# Patient Record
Sex: Female | Born: 1935 | Race: White | Hispanic: No | Marital: Single
Health system: Midwestern US, Academic
[De-identification: ages and names within clinical notes are randomized; demographics above are authoritative.]

---

## 2018-06-15 LAB — COMPREHENSIVE METABOLIC PANEL
Lab: 0.3
Lab: 0.9
Lab: 10
Lab: 136
Lab: 15 — ABNORMAL HIGH (ref 0–14)
Lab: 18
Lab: 21
Lab: 24
Lab: 27
Lab: 3.8
Lab: 3.9
Lab: 52
Lab: 61
Lab: 7.8
Lab: 94
Lab: 98

## 2018-06-15 LAB — CBC: Lab: 9.2

## 2018-07-14 ENCOUNTER — Encounter: Admit: 2018-07-14 | Discharge: 2018-07-14 | Payer: MEDICARE

## 2018-07-14 DIAGNOSIS — I10 Essential (primary) hypertension: Principal | ICD-10-CM

## 2018-07-14 DIAGNOSIS — E119 Type 2 diabetes mellitus without complications: ICD-10-CM

## 2018-07-15 ENCOUNTER — Encounter: Admit: 2018-07-15 | Discharge: 2018-07-15 | Payer: MEDICARE

## 2018-07-16 ENCOUNTER — Encounter: Admit: 2018-07-16 | Discharge: 2018-07-16 | Payer: MEDICARE

## 2018-07-16 ENCOUNTER — Ambulatory Visit: Admit: 2018-07-16 | Discharge: 2018-07-17 | Payer: MEDICARE

## 2018-07-16 DIAGNOSIS — Z8249 Family history of ischemic heart disease and other diseases of the circulatory system: ICD-10-CM

## 2018-07-16 DIAGNOSIS — R079 Chest pain, unspecified: ICD-10-CM

## 2018-07-16 DIAGNOSIS — R0602 Shortness of breath: ICD-10-CM

## 2018-07-16 DIAGNOSIS — I10 Essential (primary) hypertension: Principal | ICD-10-CM

## 2018-07-16 DIAGNOSIS — E119 Type 2 diabetes mellitus without complications: ICD-10-CM

## 2018-07-16 MED ORDER — CARVEDILOL 25 MG PO TAB
25 mg | ORAL_TABLET | Freq: Two times a day (BID) | ORAL | 11 refills | 90.00000 days | Status: DC
Start: 2018-07-16 — End: 2018-08-31

## 2018-07-16 MED ORDER — CHLORTHALIDONE 25 MG PO TAB
25 mg | ORAL_TABLET | Freq: Two times a day (BID) | ORAL | 3 refills | Status: DC
Start: 2018-07-16 — End: 2018-09-10

## 2018-07-17 ENCOUNTER — Encounter: Admit: 2018-07-17 | Discharge: 2018-07-17 | Payer: MEDICARE

## 2018-07-20 ENCOUNTER — Encounter: Admit: 2018-07-20 | Discharge: 2018-07-20

## 2018-07-20 DIAGNOSIS — I10 Essential (primary) hypertension: Secondary | ICD-10-CM

## 2018-07-20 DIAGNOSIS — R0602 Shortness of breath: Secondary | ICD-10-CM

## 2018-07-20 DIAGNOSIS — E119 Type 2 diabetes mellitus without complications: Secondary | ICD-10-CM

## 2018-07-20 DIAGNOSIS — Z8249 Family history of ischemic heart disease and other diseases of the circulatory system: Secondary | ICD-10-CM

## 2018-07-20 DIAGNOSIS — R079 Chest pain, unspecified: Secondary | ICD-10-CM

## 2018-07-20 LAB — LIPID PROFILE
Lab: 109
Lab: 159
Lab: 22
Lab: 3
Lab: 47
Lab: 90

## 2018-07-20 LAB — LIVER FUNCTION PANEL
Lab: 0.2
Lab: 0.5
Lab: 22
Lab: 27
Lab: 3.8
Lab: 52
Lab: 7.5

## 2018-07-20 MED ORDER — HYDRALAZINE 25 MG PO TAB
12.5 mg | ORAL_TABLET | Freq: Two times a day (BID) | ORAL | 11 refills | 30.00000 days | Status: DC
Start: 2018-07-20 — End: 2018-07-24

## 2018-07-20 NOTE — Telephone Encounter
Received a message stating the [facility] has left a few messages asking for clonidine clarification.  Returned call to [employer].  Deb is reporting patient's blood pressure was 190/90 yesterday and is asking to keep clonidine PRN.    No sx reported.  Will review with [facility] and call [facility] back with recommendations.        Deb at [facility] phone number [phone]

## 2018-07-20 NOTE — Telephone Encounter
Discussed with [facility].  Plan to restart hydralazine 12.5 mg bid.  Called recommendations to Deb at the [facility] and sent to the pharmacy.  No further needs identified at this time.

## 2018-07-21 ENCOUNTER — Encounter: Admit: 2018-07-21 | Discharge: 2018-07-21

## 2018-07-21 DIAGNOSIS — I10 Essential (primary) hypertension: Secondary | ICD-10-CM

## 2018-07-21 DIAGNOSIS — R079 Chest pain, unspecified: Secondary | ICD-10-CM

## 2018-07-21 NOTE — Progress Notes
Echo ordered per [place]. Order faxed to [facility] scheduling.  Scheduling will call [facility] to schedule.

## 2018-07-22 ENCOUNTER — Encounter: Admit: 2018-07-22 | Discharge: 2018-07-22

## 2018-07-22 NOTE — Telephone Encounter
Bp 1 hour ago 228/96 cholorithodone 209/94 74. Discussed with [facility] orders received to increase hydralazine to 25mg  BID.Called to [place] at the [place] center

## 2018-07-23 ENCOUNTER — Encounter: Admit: 2018-07-23 | Discharge: 2018-07-23

## 2018-07-23 NOTE — Telephone Encounter
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  This morning her BP was 207/99 , no c/o symptoms.  She took her morning medications and Deb recheck her BP at 8:45 and it was 167/71  And sister c/o of being light headed and a slight HA.  I asked Deb to be sure she stays well hydrated and see how she is doing in the AM after two day of taking the higher dose of HCTZ. She agreed and will be in tough tomorrow. Gaynelle Cage, RN

## 2018-07-24 ENCOUNTER — Encounter: Admit: 2018-07-24 | Discharge: 2018-07-24

## 2018-07-24 DIAGNOSIS — I10 Essential (primary) hypertension: Secondary | ICD-10-CM

## 2018-07-27 ENCOUNTER — Ambulatory Visit: Admit: 2018-07-27 | Discharge: 2018-07-28

## 2018-07-27 DIAGNOSIS — R42 Dizziness and giddiness: Secondary | ICD-10-CM

## 2018-07-27 DIAGNOSIS — R0600 Dyspnea, unspecified: Secondary | ICD-10-CM

## 2018-07-27 DIAGNOSIS — R609 Edema, unspecified: Secondary | ICD-10-CM

## 2018-07-27 DIAGNOSIS — I10 Essential (primary) hypertension: Secondary | ICD-10-CM

## 2018-07-28 ENCOUNTER — Encounter: Admit: 2018-07-28 | Discharge: 2018-07-28

## 2018-07-28 DIAGNOSIS — I10 Essential (primary) hypertension: Secondary | ICD-10-CM

## 2018-07-28 DIAGNOSIS — R42 Dizziness and giddiness: Secondary | ICD-10-CM

## 2018-07-28 DIAGNOSIS — R0600 Dyspnea, unspecified: Secondary | ICD-10-CM

## 2018-07-28 DIAGNOSIS — R609 Edema, unspecified: Secondary | ICD-10-CM

## 2018-08-03 ENCOUNTER — Encounter: Admit: 2018-08-03 | Discharge: 2018-08-03

## 2018-08-03 NOTE — Telephone Encounter
Received verbal result for COVID testing - not detected/negative.  [place] center nurse will try to get [place] a hard copy of the result ASAP.

## 2018-08-03 NOTE — Telephone Encounter
Deb, from the [place] center called to let us know that pt is still having BP issues.  She faxed some recent blood pressures.  Pt has an OV tomorrow in [place] with Dr. Virgina Organ.  They are awaiting other testing and hope for results today.     Recent blood pressures:     DATE  TIME  Blood Pressures    07/26/2018 0700  156/87  07/27/2018 0700  184/88    07/28/2018 0700  156/87    2000  164/66    07/29/2018 2000  142/71  07/30/2018 0930  189/84  6//11  1950  179/78    6/12  08  157/75    2000  169/75    6/13  2100  202/98  6/14  2000  192/79    6/15  0700  184/82    She confirms pt is taking medications as ordered.

## 2018-08-03 NOTE — Telephone Encounter
08/03/18 called and spoke with pt nurse and she stated pt had an echo and duplex at [facility] and pt has seen pcp Dr. Helayne Seminole recently.     I have sent a STAT request for records to both places edh

## 2018-08-03 NOTE — Telephone Encounter
Deb, from the [place] center called to let us know that pt is still having BP issues.  She faxed some recent blood pressures.  Pt has an OV tomorrow in [place] with Dr. Virgina Organ.  They are awaiting other testing and hope for results today.     Recent blood pressures:     DATE  TIME  Blood Pressures    07/26/2018 0700  156/87  07/27/2018 0700  184/88    07/28/2018 0700  156/87    2000  164/66    07/29/2018 2000  142/71  07/30/2018 0930  189/84  6//11  1950  179/78    6/12  08  157/75    2000  169/75    6/13  2100  202/98  6/14  2000  192/79    6/15  0700  184/82    She confirms pt is taking medications as ordered.

## 2018-08-04 ENCOUNTER — Encounter: Admit: 2018-08-04 | Discharge: 2018-08-04

## 2018-08-04 ENCOUNTER — Ambulatory Visit: Admit: 2018-08-04 | Discharge: 2018-08-05

## 2018-08-04 DIAGNOSIS — R42 Dizziness and giddiness: Secondary | ICD-10-CM

## 2018-08-04 DIAGNOSIS — Z8249 Family history of ischemic heart disease and other diseases of the circulatory system: Secondary | ICD-10-CM

## 2018-08-04 DIAGNOSIS — R0602 Shortness of breath: Secondary | ICD-10-CM

## 2018-08-04 DIAGNOSIS — E119 Type 2 diabetes mellitus without complications: Secondary | ICD-10-CM

## 2018-08-04 DIAGNOSIS — I10 Essential (primary) hypertension: Secondary | ICD-10-CM

## 2018-08-04 DIAGNOSIS — R0989 Other specified symptoms and signs involving the circulatory and respiratory systems: Secondary | ICD-10-CM

## 2018-08-04 MED ORDER — CLONIDINE 0.2 MG/24 HR TD PTWK
1 | MEDICATED_PATCH | TRANSDERMAL | 11 refills | Status: DC
Start: 2018-08-04 — End: 2018-08-18

## 2018-08-11 ENCOUNTER — Encounter: Admit: 2018-08-11 | Discharge: 2018-08-11

## 2018-08-11 NOTE — Progress Notes
Received 12 page fax from [facility] reporting BP readings and general update on pt since starting Clonidine patch 0.2mg  Q 7 days at OV with Dr. Samantha Crimes [facility]) 6/12. Daily nurse notes in summary describe pt still having some dizziness, but only slight. Nurse notes also indicate pt has noticed less dry mouth since switching to the patch and the patch is not bothering her skin. Call back # on fax listed as main switchboard [phone] prn. BP remains suboptimally controlled as per updates as follows:    6/15: 164/92   184/91   190/81  6/16: 165/89   114/89   149/80   190/89  6/17: 190/88   152/72   143/73   148/73  6/18: 164/59   153/84   151/68  6/19: 155/71   140/68   137/77  6/20: 130/69   130/71   152/80  6/21: 159/75   182/81   171/75  6/22: 175/72   140/74    After fax, received call from Bailey at [place] center stating pt's Catapres patch is supposed to come off today. 172/82 this AM and now 182/81 so she went ahead and gave the chlorthalidone as scheduled. She stated she just wanted to double check with [facility] if they should continue or wait another week before putting on another patch. She left call back # as [phone].     Spoke to Deb in f/u and advised the Catapres patch per [facility] was to be an ongoing medication, but patch to be changed weekly. The RX on file includes 11 refills. Pt to f/u with [facility] again in the office next week for further eval. Deb verbalized understanding and states she will put new patch on pt now, she just wanted to be sure it was intended to be an ongoing medication as their was some discrepancy on her end about if it was only for a one week trial vs ongoing. No further needs identified at this time.

## 2018-08-18 ENCOUNTER — Ambulatory Visit: Admit: 2018-08-18 | Discharge: 2018-08-19

## 2018-08-18 ENCOUNTER — Encounter: Admit: 2018-08-18 | Discharge: 2018-08-18

## 2018-08-18 DIAGNOSIS — R0602 Shortness of breath: Secondary | ICD-10-CM

## 2018-08-18 DIAGNOSIS — R0989 Other specified symptoms and signs involving the circulatory and respiratory systems: Secondary | ICD-10-CM

## 2018-08-18 DIAGNOSIS — I10 Essential (primary) hypertension: Secondary | ICD-10-CM

## 2018-08-18 DIAGNOSIS — E119 Type 2 diabetes mellitus without complications: Secondary | ICD-10-CM

## 2018-08-18 DIAGNOSIS — T887XXA Unspecified adverse effect of drug or medicament, initial encounter: Secondary | ICD-10-CM

## 2018-08-18 MED ORDER — CLONIDINE 0.3 MG/24 HR TD PTWK
1 | MEDICATED_PATCH | TRANSDERMAL | 11 refills | Status: DC
Start: 2018-08-18 — End: 2018-09-10

## 2018-08-21 ENCOUNTER — Encounter: Admit: 2018-08-21 | Discharge: 2018-08-21

## 2018-08-21 NOTE — Telephone Encounter
Received page from [place] at [facility] due to concerns since medication change (clonidine patch increased).  Called to discuss.  Deb states that "all along patient was having high blood pressures (142/72) but states that she has been having pain on right side of her neck and her vision is worse today than it has been.).    Since the clonidine increase patient's blood pressures consistently in the 140s/80s, she had these symptoms of general weakness and vision issues.  Advised Deb to take patient to ER.  They are agreeable to taking her to [place] at this time.

## 2018-08-25 ENCOUNTER — Encounter: Admit: 2018-08-25 | Discharge: 2018-08-25

## 2018-08-25 NOTE — Progress Notes
Order for stress test faxed to [facility] scheduling. Scheduling will call pt/[facility] to schedule and instruct for stress test. No Auth required  Ref # [id number]. [place] center is aware.

## 2018-08-26 NOTE — Progress Notes
Received msg on nurse line from [place] with [place] scheduling to let us know pt is sched for 7/23 at 0830. She left call back # of [phone] prn. No further needs identified at this time.

## 2018-08-31 ENCOUNTER — Encounter: Admit: 2018-08-31 | Discharge: 2018-08-31

## 2018-08-31 ENCOUNTER — Ambulatory Visit: Admit: 2018-08-31 | Discharge: 2018-09-01

## 2018-08-31 DIAGNOSIS — E119 Type 2 diabetes mellitus without complications: Secondary | ICD-10-CM

## 2018-08-31 DIAGNOSIS — R0989 Other specified symptoms and signs involving the circulatory and respiratory systems: Secondary | ICD-10-CM

## 2018-08-31 DIAGNOSIS — R0602 Shortness of breath: Secondary | ICD-10-CM

## 2018-08-31 DIAGNOSIS — T887XXA Unspecified adverse effect of drug or medicament, initial encounter: Secondary | ICD-10-CM

## 2018-08-31 DIAGNOSIS — I10 Essential (primary) hypertension: Secondary | ICD-10-CM

## 2018-08-31 MED ORDER — BISOPROLOL FUMARATE 10 MG PO TAB
10 mg | ORAL_TABLET | Freq: Two times a day (BID) | ORAL | 11 refills | 90.00000 days | Status: DC
Start: 2018-08-31 — End: 2018-09-10

## 2018-09-08 ENCOUNTER — Encounter: Admit: 2018-09-08 | Discharge: 2018-09-08

## 2018-09-08 ENCOUNTER — Ambulatory Visit: Admit: 2018-09-08 | Discharge: 2018-09-09

## 2018-09-08 DIAGNOSIS — I10 Essential (primary) hypertension: Secondary | ICD-10-CM

## 2018-09-08 DIAGNOSIS — R0602 Shortness of breath: Secondary | ICD-10-CM

## 2018-09-10 ENCOUNTER — Encounter: Admit: 2018-09-10 | Discharge: 2018-09-10

## 2018-09-10 ENCOUNTER — Ambulatory Visit: Admit: 2018-09-10 | Discharge: 2018-09-11

## 2018-09-10 DIAGNOSIS — I10 Essential (primary) hypertension: Secondary | ICD-10-CM

## 2018-09-10 DIAGNOSIS — T887XXA Unspecified adverse effect of drug or medicament, initial encounter: Secondary | ICD-10-CM

## 2018-09-10 DIAGNOSIS — R0602 Shortness of breath: Secondary | ICD-10-CM

## 2018-09-10 DIAGNOSIS — E119 Type 2 diabetes mellitus without complications: Secondary | ICD-10-CM

## 2018-09-10 DIAGNOSIS — R0989 Other specified symptoms and signs involving the circulatory and respiratory systems: Secondary | ICD-10-CM

## 2018-09-10 MED ORDER — BISOPROLOL FUMARATE 10 MG PO TAB
10 mg | ORAL_TABLET | Freq: Two times a day (BID) | ORAL | 3 refills | 90.00000 days | Status: DC
Start: 2018-09-10 — End: 2018-09-17

## 2018-09-10 MED ORDER — OLMESARTAN 40 MG PO TAB
40 mg | ORAL_TABLET | Freq: Every day | ORAL | 3 refills | 90.00000 days | Status: AC
Start: 2018-09-10 — End: ?

## 2018-09-10 MED ORDER — CLONIDINE 0.3 MG/24 HR TD PTWK
1 | MEDICATED_PATCH | TRANSDERMAL | 3 refills | Status: AC
Start: 2018-09-10 — End: ?

## 2018-09-10 MED ORDER — CHLORTHALIDONE 25 MG PO TAB
25 mg | ORAL_TABLET | Freq: Two times a day (BID) | ORAL | 3 refills | Status: DC
Start: 2018-09-10 — End: 2019-08-20

## 2018-09-10 NOTE — Progress Notes
Date of Service: 09/10/2018    Braydee Shimkus is a 85 y.o. female.       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  At her last office visit on 08/22/2018 I asked him to discontinue the carvedilol and I initiated bisoprolol 10 mg p.o. twice daily.    Patient is here today for a follow-up office visit, we recorded in the office of blood pressure 128/80 mmHg in the right arm and 132/80 4 mm of making the left arm with a heart rate of 61 bpm.  Patient states that at the [facility] center where she permanently resides her blood pressure is in the range of 140-150 mmHg (systolic).    She overall feels better, she does not have headaches anymore.  She was also evaluated with a myocardial perfusion imaging study on 09/08/2018, the perfusion pattern was normal, and ejection fraction was low normal at 63%.         Vitals:    09/10/18 1053 09/10/18 1102   BP: 128/80 132/84   BP Source: Arm, Right Upper Arm, Left Upper   Pulse: 61    Temp: 36.6 ???C (97.9 ???F)    SpO2: 98%    Weight: 66.7 kg (147 lb)    Height: 1.524 m (5')    PainSc: Zero      Body mass index is 28.71 kg/m???.     Past Medical History  Patient Active Problem List    Diagnosis Date Noted   ??? Medication side effect 08/18/2018   ??? Labile blood pressure 08/04/2018   ??? Family history of coronary artery disease 07/16/2018   ??? Shortness of breath 07/16/2018   ??? Chest pain 07/14/2018   ??? Essential hypertension 07/14/2018   ??? Type II diabetes mellitus (HCC) 07/14/2018         Review of Systems   Constitution: Negative.   HENT: Negative.    Eyes: Negative.    Cardiovascular: Negative.    Respiratory: Negative.    Endocrine: Negative.    Hematologic/Lymphatic: Negative.    Skin: Negative.    Musculoskeletal: Negative.    Gastrointestinal: Negative.    Genitourinary: Negative.    Neurological: Negative. Psychiatric/Behavioral: Negative.    Allergic/Immunologic: Negative.        Physical Exam  General Appearance: normal in appearance, patient is in a wheelchair  Skin: warm, moist, no ulcers or xanthomas  Eyes: conjunctivae and lids normal, pupils are equal and round  Lips & Oral Mucosa: no pallor or cyanosis  Neck Veins: neck veins are flat, neck veins are not distended  Chest Inspection: chest is normal in appearance  Respiratory Effort: breathing comfortably, no respiratory distress  Auscultation/Percussion: lungs clear to auscultation, no rales or rhonchi, no wheezing  Cardiac Rhythm: regular rhythm and normal rate  Cardiac Auscultation: S1, S2 normal, no rub, no gallop  Murmurs: no murmur  Carotid Arteries: normal carotid upstroke bilaterally, no bruit  Lower Extremity Edema: no lower extremity edema  Abdominal Exam: soft, non-tender, no masses, bowel sounds normal  Liver & Spleen: no organomegaly  Language and Memory: patient responsive and seems to comprehend information  Neurologic Exam: neurological assessment grossly intact      Cardiovascular Studies      Problems Addressed Today  Encounter Diagnoses   Name Primary?   ???  Essential hypertension Yes   ??? Type 2 diabetes mellitus without complication, without long-term current use of insulin (HCC)    ??? Shortness of breath    ??? Medication side effect    ??? 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  At present time her blood pressure is under good control.  Patient is stable from a cardiac standpoint.    Plan:    1.  I asked the patient to continue the following antihypertensive drug regimen: Bisoprolol 10 mg p.o. twice daily chlorthalidone 25 mg p.o. twice daily clonidine patch 0.3 mg/day q. 7 days and olmesartan 40 mg p.o. daily.  2.  Also recommended to continue physical activity and a low-salt diet.  3.  Follow-up office visit on October 20, 2018.         Current Medications (including today's revisions)  ??? acetaminophen (TYLENOL) 500 mg tablet Take 500 mg by mouth twice daily. Max of 4,000 mg of acetaminophen in 24 hours.   ??? bisoprolol (ZEBETA) 10 mg tablet Take one tablet by mouth twice daily.   ??? chlorthalidone (HYGROTON) 25 mg tablet Take one tablet by mouth twice daily.   ??? cloNIDine (CATAPRES-TTS-3) 0.3 mg/day patch Apply one patch to top of skin as directed every 7 days.   ??? Dextromethorphan-Guaifenesin (CORICIDIN HBP CHEST CONG-COUGH) 10-200 mg cap Take 1 capsule by mouth as Needed.   ??? diclofenac (VOLTAREN) 1 % topical gel    ??? ergocalciferol (VITAMIN D-2) 1,250 mcg (50,000 unit) capsule Take 1 capsule by mouth every 7 days.   ??? guaiFENesin LA (MUCINEX) 600 mg tablet Take 600 mg by mouth twice daily as needed.   ??? JANUVIA 100 mg tab tablet Take 1 tablet by mouth daily.   ??? metFORMIN (GLUCOPHAGE) 1,000 mg tablet Take 1 tablet by mouth twice daily.   ??? olmesartan (BENICAR) 40 mg tablet Take 1 tablet by mouth daily.   ??? olopatadine (PAZEO) 0.7 % drop Place  into or around eye(s).   ??? PEG 400-Propylene Glycol (SYSTANE (PROPYLENE GLYCOL)) 0.4-0.3 % drop Place  into or around eye(s).   ??? saliva stimulant comb. no.3 (BIOTENE MOISTURIZING MOUTH MM) by Mucous Membrane route.   ??? VENTOLIN HFA 90 mcg/actuation aerosol inhaler Inhale 2 puffs by mouth into the lungs every 6 hours as needed for Wheezing or Shortness of Breath. Shake well before use.

## 2018-09-11 ENCOUNTER — Encounter: Admit: 2018-09-11 | Discharge: 2018-09-11

## 2018-09-11 NOTE — Telephone Encounter
Received phone call from [facility] (nurse with [facility] [phone]). She is wanting to clarify the patients blood pressure medications.     Per Dr. Jacqlyn Krauss note from [place] yesterday:     "1.  I asked the patient to continue the following antihypertensive drug regimen: Bisoprolol 10 mg p.o. twice daily chlorthalidone 25 mg p.o. twice daily clonidine patch 0.3 mg/day q. 7 days and olmesartan 40 mg p.o. daily.  2.  Also recommended to continue physical activity and a low-salt diet.  3.  Follow-up office visit on October 20, 2018. "        She was going to check the patients med list and call back if it didn't match

## 2018-09-15 ENCOUNTER — Encounter: Admit: 2018-09-15 | Discharge: 2018-09-15

## 2018-09-15 NOTE — Telephone Encounter
Received fax from [facility] reporting that patient had elevated BP today.  Her blood pressure readings from today are as follows: 195/87, 182/87, and 162/76. Heart rate around 60.     Called and discussed with RN at care center.  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  She states that as her blood pressure came down this afternoon, her headache did improve.  She denies any additional symptoms for the patient, no chest pain, shortness of breath or any other complaints.    Verified medication list, pt is taking medications as prescribed.  She does have follow up with Dr. Virgina Organ scheduled on 9/1 in [place].    Will route to Dr. Virgina Organ for any additional recommendations and will follow up with [facility] [phone]).

## 2018-09-16 NOTE — Telephone Encounter
Discussed with Dr. Virgina Organ.  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  She states to not make any long term changes at this time, but that we can give an extra dose of Benicar 20mg  if blood pressure spikes again.    Discussed with RN at [facility].  She verbalized understanding and confirmed instructions.  Will callback with any questions, concerns or problems.

## 2018-09-17 ENCOUNTER — Encounter: Admit: 2018-09-17 | Discharge: 2018-09-17

## 2018-09-17 MED ORDER — BISOPROLOL FUMARATE 10 MG PO TAB
15 mg | ORAL_TABLET | Freq: Two times a day (BID) | ORAL | 11 refills | 90.00000 days | Status: DC
Start: 2018-09-17 — End: 2019-07-29

## 2018-09-21 NOTE — Telephone Encounter
Received new fax from [place] center reporting "Benicar not effective, BP 183/76 apical pulse 56." dated 09/21/18. Routed to [place] staff to review with Dr. Samantha Crimes [facility]) tomorrow.

## 2018-09-21 NOTE — Telephone Encounter
Received fax from [facility] staff reporting pt's SBP > parameters with BP 185/88 and apical pulse 60bpm so pt was given Benicar medication as prescribed. No further needs identified at this time.

## 2018-09-22 MED ORDER — SPIRONOLACTONE 25 MG PO TAB
12.5 mg | ORAL_TABLET | Freq: Every day | ORAL | 11 refills | 90.00000 days | Status: DC
Start: 2018-09-22 — End: 2019-02-16

## 2018-09-22 NOTE — Telephone Encounter
Best number to reach nurse at the [facility] is [phone].  [facility] okay'd Benicar dosing to 40 mg daily in the evening and extra dose up to 40 mg as needed for HTN.  Recommendations called to [place] at the [place] center.

## 2018-09-22 NOTE — Telephone Encounter
Reviewed bp readings with [facility].  Plan to start spironolactone 12.5 mg daily and continue all other medications.  [facility] with [place] recommendations [phone]) and discussed with nurse.  Rx sent to pharmacy.  Nursing will continue to monitor bp and call back with questions or concerns.

## 2018-09-22 NOTE — Telephone Encounter
Received phone call requesting recommendations from Dr. Virgina Organ.  Andrea Branch' BP was elevated again today.  This morning it was 170/72, they gave additional 20mg  Benicar rechecked and it was 182/78, 189/88.  Her only complaint is a headache, no chest pain or shortness of breath.

## 2018-09-29 ENCOUNTER — Encounter: Admit: 2018-09-29 | Discharge: 2018-09-29

## 2018-09-29 NOTE — Telephone Encounter
Received fax from [facility] reporting blood pressure continues to run high.  Nursing at the [facility] center was instructed by our office to call and report elevated bp, but per [facility] we are asking nursing to call if pt is symptomatic with elevated bp.   Patient reported she has a tightness/pressure in her head when her bp is elevated.  They will call if she has sx with elevated bp.   Pt was scheduled to see [facility] for further evaluation of uncontrolled HTN, but pt refused appt.  Her bp remains difficult to manage.  [facility] would like pt to reschedule appt with [facility] and pt has agreed.  Flag sent to [place]. We will call [facility] back with date and time to see [facility].  [facility] nursing number [phone].     She is currently taking  Bisoprolol 15 mg bid, clonidine 3 mg patch q 7 days, olmesartan 40 mg daily and 20 mg prn up to 40 mg as needed for HTN, and spironolactone 12.5 mg daily.

## 2018-09-29 NOTE — Telephone Encounter
Notified [facility] with date, time and location for OV with [facility].  No further needs at this time.

## 2018-10-13 ENCOUNTER — Encounter: Admit: 2018-10-13 | Discharge: 2018-10-13

## 2018-10-20 ENCOUNTER — Encounter: Admit: 2018-10-20 | Discharge: 2018-10-20

## 2018-10-20 ENCOUNTER — Ambulatory Visit: Admit: 2018-10-20 | Discharge: 2018-10-21

## 2018-10-20 DIAGNOSIS — T887XXA Unspecified adverse effect of drug or medicament, initial encounter: Secondary | ICD-10-CM

## 2018-10-20 DIAGNOSIS — E119 Type 2 diabetes mellitus without complications: Secondary | ICD-10-CM

## 2018-10-20 DIAGNOSIS — I10 Essential (primary) hypertension: Secondary | ICD-10-CM

## 2018-10-20 DIAGNOSIS — Z8249 Family history of ischemic heart disease and other diseases of the circulatory system: Secondary | ICD-10-CM

## 2018-10-20 DIAGNOSIS — R0989 Other specified symptoms and signs involving the circulatory and respiratory systems: Secondary | ICD-10-CM

## 2018-10-20 DIAGNOSIS — R079 Chest pain, unspecified: Secondary | ICD-10-CM

## 2018-10-20 DIAGNOSIS — C50112 Malignant neoplasm of central portion of left female breast: Secondary | ICD-10-CM

## 2018-10-20 DIAGNOSIS — R0602 Shortness of breath: Secondary | ICD-10-CM

## 2018-10-21 ENCOUNTER — Encounter: Admit: 2018-10-21 | Discharge: 2018-10-21

## 2018-10-21 DIAGNOSIS — I10 Essential (primary) hypertension: Secondary | ICD-10-CM

## 2018-10-21 NOTE — Telephone Encounter
-----   Message from Erle Crocker, MD sent at 10/21/2018  8:53 AM CDT -----  We need another. I saw the report and there wil be questions.  ----- Message -----  From: Aundria Rud, RN  Sent: 10/21/2018   7:36 AM CDT  To: Erle Crocker, MD    She had one at [facility] at the beginning of June.  Working on receiving images.  Report is in [place].    ----- Message -----  From: Erle Crocker, MD  Sent: 10/20/2018   9:07 PM CDT  To: Aundria Rud, RN    We need renal artery duplex on her at thie time of visit.    Comes from [facility] so need it

## 2018-10-21 NOTE — Telephone Encounter
Per scheduling, patient saw Dr. Virgina Organ yesterday and does not want to keep appointment with Dr. Lyndel Safe.  Will follow up with [place] in 3 months.  BP under better control.

## 2018-12-10 ENCOUNTER — Encounter: Admit: 2018-12-10 | Discharge: 2018-12-10 | Payer: MEDICARE

## 2019-02-08 ENCOUNTER — Encounter: Admit: 2019-02-08 | Discharge: 2019-02-08 | Payer: MEDICARE

## 2019-02-16 ENCOUNTER — Encounter: Admit: 2019-02-16 | Discharge: 2019-02-16 | Payer: MEDICARE

## 2019-02-16 DIAGNOSIS — R0989 Other specified symptoms and signs involving the circulatory and respiratory systems: Secondary | ICD-10-CM

## 2019-02-16 DIAGNOSIS — Z8249 Family history of ischemic heart disease and other diseases of the circulatory system: Secondary | ICD-10-CM

## 2019-02-16 DIAGNOSIS — E119 Type 2 diabetes mellitus without complications: Secondary | ICD-10-CM

## 2019-02-16 DIAGNOSIS — T887XXA Unspecified adverse effect of drug or medicament, initial encounter: Secondary | ICD-10-CM

## 2019-02-16 DIAGNOSIS — I10 Essential (primary) hypertension: Secondary | ICD-10-CM

## 2019-02-16 DIAGNOSIS — C50112 Malignant neoplasm of central portion of left female breast: Secondary | ICD-10-CM

## 2019-02-16 MED ORDER — SPIRONOLACTONE 25 MG PO TAB
25 mg | ORAL_TABLET | Freq: Every day | ORAL | 11 refills | 46.00000 days | Status: DC
Start: 2019-02-16 — End: 2019-05-13

## 2019-02-16 NOTE — Progress Notes
Date of Service: 02/16/2019    Andrea Branch is a 85 y.o. female.       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  We have been following her for blood pressure related issues.  Patient's blood pressure has been difficult to control.  She reports that today in the morning the blood pressure was 161/80 mmHg and sometimes when she wakes up in the morning her blood pressure is more elevated.  She is currently on a regimen that includes beta-blocker, chlorthalidone, clonidine, an ARB and also on Aldactone inhibitor.  However, Sister Andrea Branch states that for the most part her blood pressure is below 140/90.    She was probably noncompliant with a low-salt diet over the Christmas holiday.    She does have a history of left breast cancer, patient underwent lumpectomy this last September, she is currently on Arimidex.    Previous work-up with an echocardiogram and a perfusion imaging study did not demonstrate ischemia, the systolic function is known to be normal.         Vitals:    02/16/19 1031 02/16/19 1047   BP: (!) 148/90 (!) 152/88   BP Source: Arm, Left Upper Arm, Right Upper   Patient Position: Sitting Sitting   Pulse: 70    Temp: 36.6 ?C (97.9 ?F)    TempSrc: Oral    SpO2: 99%    Weight: 67.1 kg (148 lb)    Height: 1.524 m (5')    PainSc: Zero      Body mass index is 28.9 kg/m?Marland Kitchen     Past Medical History  Patient Active Problem List    Diagnosis Date Noted   ? 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 Medication side effect 08/18/2018   ? Labile blood pressure 08/04/2018   ? Family history of coronary artery disease 07/16/2018   ? Shortness of breath 07/16/2018   ? Chest pain 07/14/2018   ? Essential hypertension 07/14/2018   ? Type II diabetes mellitus (HCC) 07/14/2018         Review of Systems   Constitution: Positive for malaise/fatigue.   HENT: Negative.    Eyes: Negative. Cardiovascular: Positive for leg swelling.   Respiratory: Negative.    Endocrine: Negative.    Hematologic/Lymphatic: Negative.    Skin: Negative.    Musculoskeletal: Negative.    Gastrointestinal: Negative.    Genitourinary: Negative.    Neurological: Negative.    Psychiatric/Behavioral: Negative.    Allergic/Immunologic: Negative.        Physical Exam  General Appearance: normal in appearance  Skin: warm, moist, no ulcers or xanthomas  Eyes: conjunctivae and lids normal, pupils are equal and round  Lips & Oral Mucosa: no pallor or cyanosis  Neck Veins: neck veins are flat, neck veins are not distended  Chest Inspection: chest is normal in appearance  Respiratory Effort: breathing comfortably, no respiratory distress  Auscultation/Percussion: lungs clear to auscultation, no rales or rhonchi, no wheezing  Cardiac Rhythm: regular rhythm and normal rate  Cardiac Auscultation: S1, S2 normal, no rub, no gallop  Murmurs: no murmur  Carotid Arteries: normal carotid upstroke bilaterally, no bruit  Lower Extremity Edema: no lower extremity edema  Abdominal Exam: soft, non-tender, no masses, bowel sounds normal  Liver & Spleen: no organomegaly  Language and Memory: patient responsive and seems to comprehend information  Neurologic Exam: neurological assessment  grossly intact      Cardiovascular Studies      Problems Addressed Today  Encounter Diagnoses   Name Primary?   ? Essential hypertension Yes   ? Type 2 diabetes mellitus without complication, without long-term current use of insulin (HCC)    ? Family history of coronary artery disease    ? Labile blood pressure    ? Medication side effect    ? 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    Patient does not have any symptoms of chest pain, no heart palpitations, no shortness of breath and no headaches.    Plan:    1.  I did ask her to increase her spironolactone 25 mg p.o. daily  2.  I suggested to stick to a strict low-sodium diet  3.  Follow-up office visit in 3-4 months.         Current Medications (including today's revisions)  ? acetaminophen (TYLENOL) 500 mg tablet Take 1,000 mg by mouth twice daily. Max of 4,000 mg of acetaminophen in 24 hours.   ? anastrozole (ARIMIDEX) 1 mg tablet Take 1 mg by mouth daily.   ? bisoprolol (ZEBETA) 10 mg tablet Take 1.5 tablets by mouth twice daily. Indications: high blood pressure   ? chlorthalidone (HYGROTON) 25 mg tablet Take one tablet by mouth twice daily. Indications: high blood pressure   ? cloNIDine (CATAPRES-TTS-3) 0.3 mg/day patch Apply one patch to top of skin as directed every 7 days.   ? Dextromethorphan-Guaifenesin (CORICIDIN HBP CHEST CONG-COUGH) 10-200 mg cap Take 1 capsule by mouth as Needed.   ? diclofenac (VOLTAREN) 1 % topical gel Apply 4 g topically to affected area twice daily.   ? ergocalciferol (VITAMIN D-2) 1,250 mcg (50,000 unit) capsule Take 1 capsule by mouth every 7 days.   ? guaiFENesin LA (MUCINEX) 600 mg tablet Take 600 mg by mouth twice daily as needed.   ? metFORMIN (GLUCOPHAGE) 500 mg tablet Take 500 mg by mouth twice daily with meals.   ? olmesartan (BENICAR) 20 mg tablet Take 20 mg by mouth as Needed.   ? olmesartan (BENICAR) 40 mg tablet Take one tablet by mouth daily. Indications: high blood pressure   ? olopatadine (PAZEO) 0.7 % drop Place  into or around eye(s) as Needed.   ? PEG 400-Propylene Glycol (SYSTANE (PROPYLENE GLYCOL)) 0.4-0.3 % drop Place  into or around eye(s) twice daily. ? saliva stimulant comb. no.3 (BIOTENE MOISTURIZING MOUTH MM) by Mucous Membrane route as Needed.   ? SITagliptin (JANUVIA) 50 mg tab Take 50 mg by mouth daily.   ? spironolactone (ALDACTONE) 25 mg tablet Take one-half tablet by mouth daily. Take with food.   ? Triamcinolone Acetonide 0.5 % oint Apply  topically to affected area three times daily.   ? VENTOLIN HFA 90 mcg/actuation aerosol inhaler Inhale 2 puffs by mouth into the lungs every 6 hours as needed for Wheezing or Shortness of Breath. Shake well before use.   ? xylitoL (XYLIMELTS) 550 mg mabt 1 tablet by Mucous Membrane route as Needed.   ? zinc sulfate 220 mg (50 mg elemental zinc) capsule Take 220 mg by mouth daily.

## 2019-05-12 ENCOUNTER — Encounter: Admit: 2019-05-12 | Discharge: 2019-05-12 | Payer: MEDICARE

## 2019-05-13 ENCOUNTER — Encounter: Admit: 2019-05-13 | Discharge: 2019-05-13 | Payer: MEDICARE

## 2019-05-13 DIAGNOSIS — N289 Disorder of kidney and ureter, unspecified: Secondary | ICD-10-CM

## 2019-05-13 DIAGNOSIS — E119 Type 2 diabetes mellitus without complications: Secondary | ICD-10-CM

## 2019-05-13 DIAGNOSIS — T887XXA Unspecified adverse effect of drug or medicament, initial encounter: Secondary | ICD-10-CM

## 2019-05-13 DIAGNOSIS — R0989 Other specified symptoms and signs involving the circulatory and respiratory systems: Secondary | ICD-10-CM

## 2019-05-13 DIAGNOSIS — Z8249 Family history of ischemic heart disease and other diseases of the circulatory system: Secondary | ICD-10-CM

## 2019-05-13 DIAGNOSIS — I10 Essential (primary) hypertension: Secondary | ICD-10-CM

## 2019-05-13 DIAGNOSIS — R0602 Shortness of breath: Secondary | ICD-10-CM

## 2019-05-13 DIAGNOSIS — C50112 Malignant neoplasm of central portion of left female breast: Secondary | ICD-10-CM

## 2019-05-13 DIAGNOSIS — R079 Chest pain, unspecified: Secondary | ICD-10-CM

## 2019-05-13 NOTE — Progress Notes
Date of Service: 05/13/2019    Andrea Branch is a 86 y.o. female.       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  She does have hypertension, hypothyroidism.  She has been seen in our office for hypertension related issues.  Currently Andrea Branch is on the following antihypertensive drug regimen: Bisoprolol 10 mg, patient takes a tablet and a half twice daily, chlorthalidone 25 mg p.o. daily, clonidine patch 0.3 mg q. 7 days and olmesartan 40 mg p.o. daily.  Her blood pressure today in the office was 128/90 mmHg in the left arm and 130/90 mmHg in the right arm with a heart rate of 81 bpm.    In the past she was evaluated with a perfusion imaging study and an echocardiogram, she was not found to have ischemia, her LV function is noted to be normal.    Patient has been expressing symptoms of shortness of breath, these are occasional, they do occur with physical activity and they are probably related to diastolic dysfunction.    She continues to remain active at the [facility].    Recently patient was evaluated in an ophthalmology office in [facility] due to left eye related issues.  She will continue to follow-up with our group.       Vitals:    05/13/19 0949 05/13/19 0950   BP: (!) 128/90 (!) 130/90   BP Source: Arm, Left Upper Arm, Right Upper   Patient Position: Sitting Sitting   Pulse: 81    SpO2: 98%    Weight: 69.3 kg (152 lb 12.8 oz)    Height: 1.524 m (5')    PainSc: Zero      Body mass index is 29.84 kg/m?Marland Kitchen     Past Medical History  Patient Active Problem List    Diagnosis Date Noted   ? 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 Medication side effect 08/18/2018   ? Labile blood pressure 08/04/2018   ? Family history of coronary artery disease 07/16/2018   ? Shortness of breath 07/16/2018   ? Chest pain 07/14/2018   ? Essential hypertension 07/14/2018   ? Type II diabetes mellitus (HCC) 07/14/2018         Review of Systems   Constitution: Negative.   HENT: Negative.    Eyes: Negative.    Cardiovascular: Negative.    Respiratory: Positive for shortness of breath.    Endocrine: Negative.    Hematologic/Lymphatic: Negative.    Skin: Negative.    Musculoskeletal: Negative.    Gastrointestinal: Negative.    Genitourinary: Negative.    Neurological: Negative.    Psychiatric/Behavioral: Negative.    Allergic/Immunologic: Negative.        Physical Exam  General Appearance: normal in appearance  Skin: warm, moist, no ulcers or xanthomas  Eyes: conjunctivae and lids normal, pupils are equal and round  Lips & Oral Mucosa: no pallor or cyanosis  Neck Veins: neck veins are flat, neck veins are not distended  Chest Inspection: chest is normal in appearance  Respiratory Effort: breathing comfortably, no respiratory distress  Auscultation/Percussion: lungs clear to auscultation, no rales or rhonchi, no wheezing  Cardiac Rhythm: regular rhythm and normal rate  Cardiac Auscultation: S1, S2 normal, no rub, no gallop  Murmurs: no murmur  Carotid Arteries: normal carotid upstroke bilaterally, no bruit  Lower  Extremity Edema: no lower extremity edema  Abdominal Exam: soft, non-tender, no masses, bowel sounds normal  Liver & Spleen: no organomegaly  Language and Memory: patient responsive and seems to comprehend information  Neurologic Exam: neurological assessment grossly intact      Cardiovascular Studies  Twelve-lead EKG demonstrated normal sinus rhythm    Problems Addressed Today  Encounter Diagnoses   Name Primary?   ? Essential hypertension Yes   ? Chest pain, unspecified type    ? Type 2 diabetes mellitus without complication, without long-term current use of insulin (HCC)    ? Family history of coronary artery disease    ? Shortness of breath    ? Labile blood pressure    ? Medication side effect    ? Malignant neoplasm of central portion of left female breast, unspecified estrogen receptor status (HCC)        Assessment and Plan      In summary: Andrea Andrea Branch is an [age]-year-old white female, she is a retired Tax adviser, patient has been having blood pressure related issues in the past, her blood pressure is better controlled, although she does have borderline elevated diastolic blood pressure which very likely causes diastolic dysfunction and in turn shortness of breath with physical activity.    Patient is known to have normal LV function and remains stable from a cardiac standpoint.    Plan:    1.  Continue all current medications  2.  I did recommend to continue low-sodium diet less than 10% sodium  3.  I did recommend physical therapy 3-4 times a week for muscular strengthening and overall increase mobility  4.  Follow-up office visit in 4 months.             Current Medications (including today's revisions)  ? acetaminophen (TYLENOL) 500 mg tablet Take 1,000 mg by mouth twice daily. Max of 4,000 mg of acetaminophen in 24 hours.   ? anastrozole (ARIMIDEX) 1 mg tablet Take 1 mg by mouth daily.   ? bisoprolol (ZEBETA) 10 mg tablet Take 1.5 tablets by mouth twice daily. Indications: high blood pressure (Patient taking differently: Take 10 mg by mouth twice daily. Indications: high blood pressure)   ? chlorthalidone (HYGROTON) 25 mg tablet Take one tablet by mouth twice daily. Indications: high blood pressure   ? cloNIDine (CATAPRES-TTS-3) 0.3 mg/day patch Apply one patch to top of skin as directed every 7 days.   ? cyanocobalamin (VITAMIN B-12) 100 mcg tablet Take 100 mcg by mouth daily.   ? Dextromethorphan-Guaifenesin (CORICIDIN HBP CHEST CONG-COUGH) 10-200 mg cap Take 1 capsule by mouth as Needed.   ? diclofenac (VOLTAREN) 1 % topical gel Apply 4 g topically to affected area twice daily.   ? ergocalciferol (vitamin D2) (VITAMIN D PO) Take 1 tablet by mouth daily.   ? metFORMIN (GLUCOPHAGE) 500 mg tablet Take 500 mg by mouth daily with breakfast.   ? olmesartan (BENICAR) 40 mg tablet Take one tablet by mouth daily. Indications: high blood pressure   ? PEG 400-Propylene Glycol (SYSTANE (PROPYLENE GLYCOL)) 0.4-0.3 % drop Place  into or around eye(s) twice daily.   ? SITagliptin (JANUVIA) 50 mg tab Take 50 mg by mouth daily.   ? VENTOLIN HFA 90 mcg/actuation aerosol inhaler Inhale 2 puffs by mouth into the lungs every 6 hours as needed for Wheezing or Shortness of Breath. Shake well before use.   ? vit A/vit C/vit E/zinc/copper (PRESERVISION AREDS PO) Take 1 capsule by mouth daily.

## 2019-05-28 ENCOUNTER — Encounter: Admit: 2019-05-28 | Discharge: 2019-05-28 | Payer: MEDICARE

## 2019-05-28 NOTE — Telephone Encounter
Received fax from pharmacist at [facility] stating that Udana continues to have high blood pressures, with SBP regularly >160.  Her heart rate is stable in the 60s.  She is taking medications as prescribed.  No BP readings are sent with pharmacy note.  Requested BP readings from [facility].    Pt's BP was controlled at [facility] on 3/25 with [facility] with BP readings of 128/90 and 130/90.    Will route to Dr. Virgina Organ for recommendations.

## 2019-07-05 ENCOUNTER — Encounter: Admit: 2019-07-05 | Discharge: 2019-07-05 | Payer: MEDICARE

## 2019-07-05 MED ORDER — CLONIDINE 0.1 MG/24 HR TD PTWK
1 | MEDICATED_PATCH | TRANSDERMAL | 3 refills | Status: AC
Start: 2019-07-05 — End: ?

## 2019-07-05 NOTE — Telephone Encounter
Dorothy RN called with concerns of patient's blood pressure running high. This morning patient's blood pressure was 200/82 Hr 68. Patient was given 0.2 clonidine and bp came down to 148/71.   Discussed in clinic with [facility]. She recommends increasing clonidine patch from 0.3 mg to 0.4 mg. Dorothy notified and order sent to pharmacy.

## 2019-07-12 ENCOUNTER — Encounter: Admit: 2019-07-12 | Discharge: 2019-07-12 | Payer: MEDICARE

## 2019-07-12 MED ORDER — HYDRALAZINE 25 MG PO TAB
25 mg | ORAL_TABLET | Freq: Three times a day (TID) | ORAL | 3 refills | 30.00000 days | Status: DC
Start: 2019-07-12 — End: 2019-08-30

## 2019-07-12 NOTE — Telephone Encounter
Received a fax from [facility] stating that Marieanne' BP is still elevated after increasing clonidine patch dose and adding PRN clonidine. The fax also included a warning from their [facility] system that states that there can be a reaction with clonidine and bisapropolol that can cause paradoxical hypertension. The Center is wanting to know if there is anything else we can add to her blood pressure medication regimen.    Some reported BP readings:    5/19- 193/82  5/19- 224/103  5/20-166/74  5/21-163/84  5/22-196/80  5/23- 168/88  5/24-194/84    Reviewed BP readings and BP medication regimen with Dr. Pierre Bali. She ordered hydralazine 25mg  TID and requested that [facility] get back in with Dr. Avie Arenas to f/u with the elevated pressures.

## 2019-07-26 ENCOUNTER — Encounter: Admit: 2019-07-26 | Discharge: 2019-07-26 | Payer: MEDICARE

## 2019-07-26 NOTE — Telephone Encounter
07/26/2019 9:05 AM  Received fax, dated 07/25/19, 0758 AM from the [facility]:        Called back at [phone].  Left message asking for a call back to check on patient and blood pressure readings.

## 2019-07-26 NOTE — Telephone Encounter
07/26/2019 1:21 PM Attempted to contact [facility], again.  No answer at desk number, [phone] (message left earlier, today).  Left a voicemail on the [facility] Nurse Cell number:  [phone].

## 2019-07-27 ENCOUNTER — Encounter: Admit: 2019-07-27 | Discharge: 2019-07-27 | Payer: MEDICARE

## 2019-07-27 NOTE — Telephone Encounter
Was able to talk to Andrea Branch care taker at facility today. She states pt. has been having increased BP with systolic numbers in the 160-190's with diastolic numbers in the 80's. Iasked if pt. had been taking her PRN clonidine. Care take states that the clonidine does not help lower the pressure anymore so pt. has been refusing it. I advised caretaker to continue to log the pt. blood pressure readings and have them ready for her appointment on the 10th. Caretaker verbalized understanding.

## 2019-07-29 ENCOUNTER — Encounter: Admit: 2019-07-29 | Discharge: 2019-07-29 | Payer: MEDICARE

## 2019-07-29 DIAGNOSIS — R079 Chest pain, unspecified: Secondary | ICD-10-CM

## 2019-07-29 DIAGNOSIS — N289 Disorder of kidney and ureter, unspecified: Secondary | ICD-10-CM

## 2019-07-29 DIAGNOSIS — E119 Type 2 diabetes mellitus without complications: Secondary | ICD-10-CM

## 2019-07-29 DIAGNOSIS — C50112 Malignant neoplasm of central portion of left female breast: Secondary | ICD-10-CM

## 2019-07-29 DIAGNOSIS — I10 Essential (primary) hypertension: Secondary | ICD-10-CM

## 2019-07-29 DIAGNOSIS — Z8249 Family history of ischemic heart disease and other diseases of the circulatory system: Secondary | ICD-10-CM

## 2019-07-29 DIAGNOSIS — R0989 Other specified symptoms and signs involving the circulatory and respiratory systems: Secondary | ICD-10-CM

## 2019-07-29 DIAGNOSIS — T887XXA Unspecified adverse effect of drug or medicament, initial encounter: Secondary | ICD-10-CM

## 2019-07-29 DIAGNOSIS — Z23 Encounter for immunization: Secondary | ICD-10-CM

## 2019-07-29 DIAGNOSIS — R0602 Shortness of breath: Secondary | ICD-10-CM

## 2019-08-20 ENCOUNTER — Encounter: Admit: 2019-08-20 | Discharge: 2019-08-20 | Payer: MEDICARE

## 2019-08-20 MED ORDER — CHLORTHALIDONE 25 MG PO TAB
25 mg | ORAL_TABLET | Freq: Two times a day (BID) | ORAL | 3 refills | Status: AC
Start: 2019-08-20 — End: ?

## 2019-08-20 NOTE — Telephone Encounter
Received call from Nicole Cella, RN [phone]) at the [facility] reporting that Andrea Branch is continuing to have episodes of high blood pressure.  She states that for the past few days, she has been having pressures above 200.  She states that it does come down after the PRN clonidine to 130/140s, but she states that Abir gets really fatigued and tired with the high readings and complains of headaches.  This is an ongoing issue for [name], but they wanted to know if Dr. Avie Arenas had any additional recommendations.    Will route to Dr. Avie Arenas for recommendations.

## 2019-08-30 ENCOUNTER — Encounter: Admit: 2019-08-30 | Discharge: 2019-08-30 | Payer: MEDICARE

## 2019-08-30 MED ORDER — HYDRALAZINE 50 MG PO TAB
50 mg | ORAL_TABLET | Freq: Three times a day (TID) | ORAL | 3 refills | 30.00000 days | Status: AC
Start: 2019-08-30 — End: ?

## 2019-09-07 ENCOUNTER — Encounter: Admit: 2019-09-07 | Discharge: 2019-09-07 | Payer: MEDICARE

## 2019-09-07 DIAGNOSIS — R0602 Shortness of breath: Secondary | ICD-10-CM

## 2019-09-07 DIAGNOSIS — I10 Essential (primary) hypertension: Secondary | ICD-10-CM

## 2019-09-07 MED ORDER — SPIRONOLACTONE 25 MG PO TAB
25 mg | ORAL_TABLET | Freq: Every day | ORAL | 3 refills | 46.00000 days | Status: AC
Start: 2019-09-07 — End: ?

## 2019-09-07 NOTE — Progress Notes
Patient is here for bp check.  She continues to run high despite increase in medications.  Most reading are above 140/80.  Patient states she has head pressure especially when her bp is elevated.  Discussed with [facility] in clinic.  Recommendations received to start aldactone 25mg  daily recheck BMP in 1wk requested bp's be faxed to [place] in 1wk.

## 2019-09-15 ENCOUNTER — Encounter: Admit: 2019-09-15 | Discharge: 2019-09-15 | Payer: MEDICARE

## 2019-09-15 DIAGNOSIS — I10 Essential (primary) hypertension: Secondary | ICD-10-CM

## 2019-09-15 DIAGNOSIS — R0602 Shortness of breath: Secondary | ICD-10-CM

## 2019-09-15 LAB — BASIC METABOLIC PANEL
Lab: 1.2 — ABNORMAL HIGH (ref 0.57–1.11)
Lab: 10 — ABNORMAL HIGH (ref 8.4–10.2)
Lab: 129 — ABNORMAL LOW (ref 136–145)
Lab: 161 — ABNORMAL HIGH (ref 70–105)
Lab: 26
Lab: 27 — ABNORMAL HIGH (ref 9.8–20.1)
Lab: 4.3
Lab: 91 — ABNORMAL LOW (ref 98–107)

## 2019-12-02 ENCOUNTER — Encounter: Admit: 2019-12-02 | Discharge: 2019-12-02 | Payer: MEDICARE

## 2019-12-02 DIAGNOSIS — Z23 Encounter for immunization: Secondary | ICD-10-CM

## 2019-12-02 DIAGNOSIS — T887XXA Unspecified adverse effect of drug or medicament, initial encounter: Secondary | ICD-10-CM

## 2019-12-02 DIAGNOSIS — I10 Essential (primary) hypertension: Secondary | ICD-10-CM

## 2019-12-02 DIAGNOSIS — C50112 Malignant neoplasm of central portion of left female breast: Secondary | ICD-10-CM

## 2019-12-02 DIAGNOSIS — E119 Type 2 diabetes mellitus without complications: Secondary | ICD-10-CM

## 2019-12-02 DIAGNOSIS — Z8249 Family history of ischemic heart disease and other diseases of the circulatory system: Secondary | ICD-10-CM

## 2019-12-02 DIAGNOSIS — I952 Hypotension due to drugs: Secondary | ICD-10-CM

## 2019-12-02 DIAGNOSIS — R51 Orthostatic headache: Secondary | ICD-10-CM

## 2019-12-02 DIAGNOSIS — R0989 Other specified symptoms and signs involving the circulatory and respiratory systems: Secondary | ICD-10-CM

## 2019-12-02 DIAGNOSIS — N289 Disorder of kidney and ureter, unspecified: Secondary | ICD-10-CM

## 2019-12-02 MED ORDER — CHLORTHALIDONE 25 MG PO TAB
25 mg | ORAL_TABLET | Freq: Every morning | ORAL | 3 refills | Status: AC
Start: 2019-12-02 — End: ?

## 2019-12-02 MED ORDER — HYDRALAZINE 50 MG PO TAB
50 mg | ORAL_TABLET | Freq: Two times a day (BID) | ORAL | 3 refills | 30.00000 days | Status: AC
Start: 2019-12-02 — End: ?

## 2019-12-02 NOTE — Progress Notes
Date of Service: 12/02/2019    Andrea Branch is a 86 y.o. female.       HPI     Andrea Branch is an 86 year old female with a history of primary hypertension that has been difficult to control.  She was last seen in the office in June 2021.    At present time patient is on several antihypertensive medications that include chlorthalidone, clonidine patch, hydralazine, spironolactone and olmesartan.    Patient did bring with her today at the office visit blood pressure recordings from the nursing home.  There are times when her blood pressure is low in the range of 103/61 mmHg and she describes symptoms of orthostatic hypotension.  She also describes a left-sided headache, not feeling too well at times due to low blood pressure and symptoms of dizziness and lightheadedness.  Patient did not have any syncope.       Vitals:    12/02/19 0826 12/02/19 0839   BP: 128/70 132/74   BP Source: Arm, Left Upper Arm, Right Upper   Patient Position: Sitting Sitting   Pulse: 80    SpO2: 98%    Weight: 70 kg (154 lb 6.4 oz)    Height: 1.524 m (5')    PainSc: Five      Body mass index is 30.15 kg/m?Marland Kitchen     Past Medical History  Patient Active Problem List    Diagnosis Date Noted   ? COVID-19 vaccine administered 07/29/2019   ? Renal insufficiency 05/13/2019   ? 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 Medication side effect 08/18/2018   ? Labile blood pressure 08/04/2018   ? Family history of coronary artery disease 07/16/2018   ? Shortness of breath 07/16/2018   ? Chest pain 07/14/2018   ? Essential hypertension 07/14/2018   ? Type II diabetes mellitus (HCC) 07/14/2018         Review of Systems   Constitutional: Positive for malaise/fatigue.   HENT: Negative.    Eyes: Negative.    Cardiovascular: Negative.    Respiratory: Positive for cough.    Endocrine: Negative.    Hematologic/Lymphatic: Negative.    Skin: Negative.    Musculoskeletal: Positive for joint pain, muscle cramps, muscle weakness and myalgias.   Gastrointestinal: Negative.    Genitourinary: Negative.    Neurological: Positive for dizziness, headaches and weakness.   Psychiatric/Behavioral: Negative.    Allergic/Immunologic: Negative.        Physical Exam  General Appearance: normal in appearance  Skin: warm, moist, no ulcers or xanthomas  Eyes: conjunctivae and lids normal, pupils are equal and round  Lips & Oral Mucosa: no pallor or cyanosis  Neck Veins: neck veins are flat, neck veins are not distended  Chest Inspection: chest is normal in appearance  Respiratory Effort: breathing comfortably, no respiratory distress  Auscultation/Percussion: lungs clear to auscultation, no rales or rhonchi, no wheezing  Cardiac Rhythm: regular rhythm and normal rate  Cardiac Auscultation: S1, S2 normal, no rub, no gallop  Murmurs: no murmur  Carotid Arteries: normal carotid upstroke bilaterally, no bruit  Lower Extremity Edema: no lower extremity edema  Abdominal Exam: soft, non-tender, no masses, bowel sounds normal  Liver & Spleen: no organomegaly  Language and Memory: patient responsive and seems to comprehend information  Neurologic Exam: neurological assessment grossly intact      Cardiovascular Studies      Cardiovascular Health Factors  Vitals BP Readings from Last 3 Encounters:   12/02/19  132/74   09/07/19 (!) 166/84   07/29/19 (!) 146/86     Wt Readings from Last 3 Encounters:   12/02/19 70 kg (154 lb 6.4 oz)   09/07/19 70.3 kg (155 lb)   07/29/19 70.8 kg (156 lb)     BMI Readings from Last 3 Encounters:   12/02/19 30.15 kg/m?   09/07/19 30.27 kg/m?   07/29/19 30.47 kg/m?      Smoking Social History     Tobacco Use   Smoking Status Never Smoker   Smokeless Tobacco Never Used      Lipid Profile Cholesterol   Date Value Ref Range Status   07/20/2018 159  Final   07/20/2018 159  Final     HDL   Date Value Ref Range Status   07/20/2018 47  Final   07/20/2018 47  Final     LDL   Date Value Ref Range Status   07/20/2018 90  Final   07/20/2018 90  Final     Triglycerides   Date Value Ref Range Status   07/20/2018 109  Final   07/20/2018 109  Final      Blood Sugar No results found for: HGBA1C  Glucose   Date Value Ref Range Status   09/15/2019 161 (H) 70 - 105 Final   04/30/2019 124 (H) 70 - 105 Final   11/25/2018 111 (H) 70 - 105 Final          Problems Addressed Today  Encounter Diagnoses   Name Primary?   ? Essential hypertension Yes   ? COVID-19 vaccine administered    ? Labile blood pressure    ? Family history of coronary artery disease    ? Malignant neoplasm of central portion of left female breast, unspecified estrogen receptor status (HCC)    ? Medication side effect    ? Renal insufficiency    ? Type 2 diabetes mellitus without complication, without long-term current use of insulin (HCC)    ? Orthostatic headache    ? 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  1.  Essential hypertension?this has been historically poorly controlled, patient required multiple office visits and adjustments in her medications, she presents today to the office with what appears to be symptoms of orthostasis and subsequent headache.  2.  Normal left ventricular systolic function?she was evaluated with an echocardiogram in June 2020, that revealed an ejection fraction of 60%  3.  No evidence of CAD?patient was evaluated with the perfusion imaging study in July 2020  4.  No evidence of renal artery stenosis?the study was performed at the local hospital in [place]      Plan:    1.  I asked Andrea Andrea Branch to make the following changes in her medications:  ? Decrease chlorthalidone to 25 mg p.o. every morning  ? Decrease hydralazine to 50 mg p.o. twice daily  2.  Continue to monitor her blood pressure and heart rate at the nursing home  3.  Apply Voltaren gel on the right upper extremity joints for osteoarthritis related pain  4.  Follow-up office visit on January 20, 2020. Current Medications (including today's revisions)  ? acetaminophen (TYLENOL) 500 mg tablet Take 1,000 mg by mouth every 6 hours as needed. Max of 4,000 mg of acetaminophen in 24 hours.    ? anastrozole (ARIMIDEX) 1 mg tablet  Take 1 mg by mouth daily.   ? carboxymethylcellulose sodium (ARTIFICIAL TEARS (CMC)) 1 % drop Apply 1 drop to both eyes every 6 hours as needed.   ? chlorthalidone (HYGROTON) 25 mg tablet Take one tablet by mouth twice daily. Indications: high blood pressure   ? cloNIDine (CATAPRES-TTS-1) 0.1 mg/day patch Apply one patch to top of skin as directed every 7 days. Apply one patch along with one 0.3 mg clonidine patch for a total of 0.4 mg.   ? cloNIDine (CATAPRES-TTS-3) 0.3 mg/day patch Apply one patch to top of skin as directed every 7 days. (Patient taking differently: Apply 1 patch to top of skin as directed daily.)   ? cloNIDine (CATAPRESS) 0.2 mg tablet Take 0.2 mg by mouth as Needed (as needed three times daily).   ? Dextromethorphan-Guaifenesin (CORICIDIN HBP CHEST CONG-COUGH) 10-200 mg cap Take 1 capsule by mouth as Needed.   ? diclofenac (VOLTAREN) 1 % topical gel Apply 4 g topically to affected area twice daily.   ? hydrALAZINE (APRESOLINE) 50 mg tablet Take one tablet by mouth three times daily.   ? hydrocortisone 1 % topical cream Apply  topically to affected area as Needed (Apply to ears).   ? Lactoperoxi/Gluc Oxid/Pot Thio (BIOTENE DRY MOUTH MM) by Mucous Membrane route five times daily as needed.   ? metFORMIN (GLUCOPHAGE) 500 mg tablet Take 500 mg by mouth daily with breakfast.   ? olmesartan (BENICAR) 40 mg tablet Take one tablet by mouth daily. Indications: high blood pressure   ? PEG 400-Propylene Glycol (SYSTANE (PROPYLENE GLYCOL)) 0.4-0.3 % drop Place  into or around eye(s) twice daily.   ? SITagliptin (JANUVIA) 50 mg tab Take 50 mg by mouth daily.   ? spironolactone (ALDACTONE) 25 mg tablet Take one tablet by mouth daily. Take with food.   ? VENTOLIN HFA 90 mcg/actuation aerosol inhaler Inhale 2 puffs by mouth into the lungs every 8 hours as needed for Wheezing or Shortness of Breath. Shake well before use.    ? vit A/vit C/vit E/zinc/copper (PRESERVISION AREDS PO) Take 1 capsule by mouth daily.

## 2020-03-02 ENCOUNTER — Encounter: Admit: 2020-03-02 | Discharge: 2020-03-02 | Payer: MEDICARE

## 2020-03-02 DIAGNOSIS — T887XXA Unspecified adverse effect of drug or medicament, initial encounter: Secondary | ICD-10-CM

## 2020-03-02 DIAGNOSIS — R0602 Shortness of breath: Secondary | ICD-10-CM

## 2020-03-02 DIAGNOSIS — E119 Type 2 diabetes mellitus without complications: Secondary | ICD-10-CM

## 2020-03-02 DIAGNOSIS — N289 Disorder of kidney and ureter, unspecified: Secondary | ICD-10-CM

## 2020-03-02 DIAGNOSIS — C50112 Malignant neoplasm of central portion of left female breast: Secondary | ICD-10-CM

## 2020-03-02 DIAGNOSIS — I10 Essential (primary) hypertension: Secondary | ICD-10-CM

## 2020-03-02 DIAGNOSIS — R079 Chest pain, unspecified: Secondary | ICD-10-CM

## 2020-03-02 DIAGNOSIS — I952 Hypotension due to drugs: Secondary | ICD-10-CM

## 2020-03-02 DIAGNOSIS — G44011 Episodic cluster headache, intractable: Secondary | ICD-10-CM

## 2020-03-02 DIAGNOSIS — R0989 Other specified symptoms and signs involving the circulatory and respiratory systems: Secondary | ICD-10-CM

## 2020-03-02 DIAGNOSIS — Z23 Encounter for immunization: Secondary | ICD-10-CM

## 2020-03-02 NOTE — Progress Notes
Date of Service: 03/02/2020    Andrea Branch is a 87 y.o. female.       HPI     Andrea Branch was last evaluated in our office in October 2021. 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 She does have a history of difficult to control and labile hypertension.    At present time patient is on clonidine patch, hydralazine and an ARB, she also takes clonidine 0.2 mg tablet as needed for blood pressure higher than 160-170 mmHg. She has not had to use the as needed clonidine.    Patient does not report symptoms of chest pain or heart palpitations, she does not have any documented history of CAD.    She does report chronic headaches, that is occipital headaches, she does not think they are any relation to physical activity or variations in blood pressure. At times her blood pressure is within normal range and if the systolic is 120 mmHg she is not taking the antihypertensive drug therapy.    Patient was last evaluated with an echocardiogram in June 2020, the systolic function was normal and there was no significant valvular abnormalities.         Vitals:    03/02/20 1534   BP: (!) 150/86   BP Source: Arm, Left Upper   Patient Position: Sitting   Pulse: 89   SpO2: 98%   Weight: 70.8 kg (156 lb)   Height: 1.524 m (5')   PainSc: Four     Body mass index is 30.47 kg/m?Marland Kitchen     Past Medical History  Patient Active Problem List    Diagnosis Date Noted   ? Orthostatic headache 12/02/2019   ? Hypotension due to drugs 12/02/2019   ? COVID-19 vaccine administered 07/29/2019   ? Renal insufficiency 05/13/2019   ? 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 Medication side effect 08/18/2018   ? Labile blood pressure 08/04/2018   ? Family history of coronary artery disease 07/16/2018   ? Shortness of breath 07/16/2018   ? Chest pain 07/14/2018   ? Essential hypertension 07/14/2018   ? Type II diabetes mellitus (HCC) 07/14/2018         Review of Systems   Constitutional: Negative.   HENT: Negative.    Eyes: Negative.    Cardiovascular: Negative.    Respiratory: Negative.    Endocrine: Negative.    Hematologic/Lymphatic: Negative.    Skin: Negative.    Musculoskeletal: Negative.    Gastrointestinal: Negative.    Genitourinary: Negative.    Neurological: Positive for headaches.   Psychiatric/Behavioral: Negative.    Allergic/Immunologic: Negative.        Physical Exam  General Appearance: normal in appearance  Skin: warm, moist, no ulcers or xanthomas  Eyes: conjunctivae and lids normal, pupils are equal and round  Lips & Oral Mucosa: no pallor or cyanosis  Neck Veins: neck veins are flat, neck veins are not distended  Chest Inspection: chest is normal in appearance  Respiratory Effort: breathing comfortably, no respiratory distress  Auscultation/Percussion: lungs clear to auscultation, no rales or rhonchi, no wheezing  Cardiac Rhythm: regular rhythm and normal rate  Cardiac Auscultation: S1, S2 normal, no rub, no gallop  Murmurs: no murmur  Carotid Arteries: normal carotid upstroke bilaterally, no bruit  Lower Extremity Edema: no lower extremity edema  Abdominal Exam: soft, non-tender, no masses, bowel  sounds normal  Liver & Spleen: no organomegaly  Language and Memory: patient responsive and seems to comprehend information  Neurologic Exam: neurological assessment grossly intact'    Cardiovascular Studies      Cardiovascular Health Factors  Vitals BP Readings from Last 3 Encounters:   03/02/20 (!) 150/86   12/02/19 132/74   09/07/19 (!) 166/84     Wt Readings from Last 3 Encounters:   03/02/20 70.8 kg (156 lb)   12/02/19 70 kg (154 lb 6.4 oz)   09/07/19 70.3 kg (155 lb)     BMI Readings from Last 3 Encounters:   03/02/20 30.47 kg/m?   12/02/19 30.15 kg/m?   09/07/19 30.27 kg/m?      Smoking Social History     Tobacco Use   Smoking Status Never Smoker   Smokeless Tobacco Never Used      Lipid Profile Cholesterol Date Value Ref Range Status   07/20/2018 159  Final   07/20/2018 159  Final     HDL   Date Value Ref Range Status   07/20/2018 47  Final   07/20/2018 47  Final     LDL   Date Value Ref Range Status   07/20/2018 90  Final   07/20/2018 90  Final     Triglycerides   Date Value Ref Range Status   07/20/2018 109  Final   07/20/2018 109  Final      Blood Sugar No results found for: HGBA1C  Glucose   Date Value Ref Range Status   09/15/2019 161 (H) 70 - 105 Final   04/30/2019 124 (H) 70 - 105 Final   11/25/2018 111 (H) 70 - 105 Final          Problems Addressed Today  Encounter Diagnoses   Name Primary?   ? Essential hypertension Yes   ? Hypotension due to drugs    ? Chest pain, unspecified type    ? COVID-19 vaccine administered    ? Labile blood pressure    ? Malignant neoplasm of central portion of left female breast, unspecified estrogen receptor status (HCC)    ? Medication side effect    ? Renal insufficiency    ? Shortness of breath    ? Type 2 diabetes mellitus without complication, without long-term current use of insulin (HCC)    ? Intractable episodic cluster headache        Assessment and Plan       In summary: This is an 87 year old [place] nun with the following  ?  1. ?Essential hypertension ? at present the patient's blood pressure is under reasonable control, she is currently on a combination of clonidine patch, hydralazine and ARB.   2. ?Normal left ventricular systolic function?she was evaluated with an echocardiogram in June 2020, that revealed an ejection fraction of 60%  3. ?No evidence of CAD?patient was evaluated with the perfusion imaging study in July 2020  4. ?No evidence of renal artery stenosis?the study was performed at the local hospital in [place]  5. Chronic occipital headaches?patient reports that this did are not in relation to hypertension, she could have underlying cervical spine osteoarthritis    Plan:    1. I did not make any changes in her medications  2. I do recommend further evaluation with cervical spine x-ray and also head CT to include a cervical spine  3. Follow-up office visit in approximately 6 months         Current Medications (including today's revisions)  ?  acetaminophen (TYLENOL) 500 mg tablet Take 1,000 mg by mouth every 6 hours as needed. Max of 4,000 mg of acetaminophen in 24 hours.    ? anastrozole (ARIMIDEX) 1 mg tablet Take 1 mg by mouth daily.   ? carboxymethylcellulose sodium (ARTIFICIAL TEARS (CMC)) 1 % drop Apply 1 drop to both eyes every 6 hours as needed.   ? chlorthalidone (HYGROTON) 25 mg tablet Take one tablet by mouth every morning. Indications: high blood pressure   ? cloNIDine (CATAPRES-TTS-1) 0.1 mg/day patch Apply one patch to top of skin as directed every 7 days. Apply one patch along with one 0.3 mg clonidine patch for a total of 0.4 mg.   ? cloNIDine (CATAPRES-TTS-3) 0.3 mg/day patch Apply one patch to top of skin as directed every 7 days. (Patient taking differently: Apply 1 patch to top of skin as directed daily.)   ? cloNIDine (CATAPRESS) 0.2 mg tablet Take 0.2 mg by mouth as Needed (as needed three times daily).   ? Dextromethorphan-Guaifenesin (CORICIDIN HBP CHEST CONG-COUGH) 10-200 mg cap Take 1 capsule by mouth as Needed.   ? diclofenac (VOLTAREN) 1 % topical gel Apply 4 g topically to affected area twice daily.   ? hydrALAZINE (APRESOLINE) 50 mg tablet Take one tablet by mouth twice daily.   ? hydrocortisone 1 % topical cream Apply  topically to affected area as Needed (Apply to ears).   ? Lactoperoxi/Gluc Oxid/Pot Thio (BIOTENE DRY MOUTH MM) by Mucous Membrane route five times daily as needed.   ? metFORMIN (GLUCOPHAGE) 500 mg tablet Take 500 mg by mouth daily with breakfast.   ? olmesartan (BENICAR) 40 mg tablet Take one tablet by mouth daily. Indications: high blood pressure   ? PEG 400-Propylene Glycol (SYSTANE (PROPYLENE GLYCOL)) 0.4-0.3 % drop Place  into or around eye(s) twice daily.   ? SITagliptin (JANUVIA) 50 mg tab Take 50 mg by mouth daily.   ? spironolactone (ALDACTONE) 25 mg tablet Take one tablet by mouth daily. Take with food.   ? VENTOLIN HFA 90 mcg/actuation aerosol inhaler Inhale 2 puffs by mouth into the lungs every 8 hours as needed for Wheezing or Shortness of Breath. Shake well before use.    ? vit A/vit C/vit E/zinc/copper (PRESERVISION AREDS PO) Take 1 capsule by mouth daily.

## 2020-03-03 IMAGING — NM NM stress 1[PERSON_NAME]<250 M<275
3 series · 60 of 60 positions shown · non-contrast
Comparison: none

09/08/18 - Procedure: ADAC MULTI GATED SESTAMIBI REGADENOSON MPI STRESS TEST
Duration and Sequencing:  Same-Day Rest/Stress

Interpretation Summary
Nuclear Report
[REDACTED] [REDACTED]
Division of Nuclear Cardiac Imaging
Consultation Report
EXAMINATION:  Gated Yechnetium-UUm Sestamibi Same-Day Rest/Stress myocardial perfusion single-photon
emission computed tomography resting regional wall function, post stress ejection fraction, and
perfusion imaging utilizing Regadenoson pharmacological stress.
Study #:  [id number]
[facility] MRN:  [id number]
[HOSPITAL] ID:
BMI: 29.3 kg/m2
INDICATIONS FOR STUDY (HISTORY):  This is a 86 year old female with a history of Hypertension and
Diabetes.  This study is being done to rule out significant myocardial ischemia.
PROCEDURAL DETAILS:  This Gated Same-Day Rest/Stress perfusion study was performed using Technetium-
99m Sestamibi as the perfusion agent.  Initially, at rest the patient received an intravenous
injection 8.3 mCi of Yechnetium-UUm Sestamibi. Planar and gated tomographic images were acquired.
Subsequently, the patient received a 5 ml intravenous infusion of Regadenoson at 0.08 mg/ml over 10
to 15 seconds.  Approximately 20 seconds later 26.1 mCi of Yechnetium-UUm Sestamibi was injected
intravenously.  Continuous electrocardiographic monitoring and serial electrocardiograms were
obtained, as well as intermittent blood pressure recordings. Planar and gated tomographic images
were subsequently acquired.  Rest images were compared to post stress images.

[rest · 6.78mm/px · 4 of 72 frames shown]
[frame 7/72]
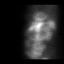
[frame 31/72]
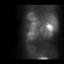
[frame 43/72]
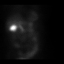
[frame 67/72]
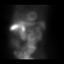

[qgsqps_results · 6.8mm · 6.78mm/px · 2 acquisitions, 7 frames shown]
[im 1/2]
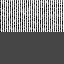
[im 1/2]
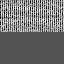
[im 1/2]
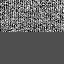
[im 2/2]
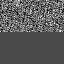
[im 2/2]
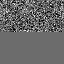
[im 2/2]
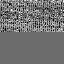
[im 2/2]
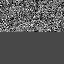

[cardiac spect · 6.78mm/px · 14 acquisitions, 49 frames shown]
[im 1/14]
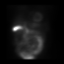
[im 1/14]
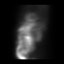
[im 1/14]
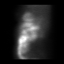
[im 2/14]
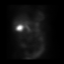
[im 2/14]
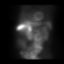
[im 2/14]
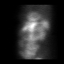
[im 2/14]
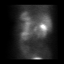
[im 3/14]
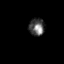
[im 3/14]
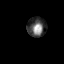
[im 3/14]
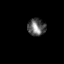
[im 4/14]
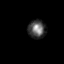
[im 4/14]
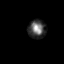
[im 4/14]
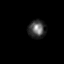
[im 4/14]
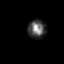
[im 5/14]
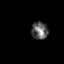
[im 5/14]
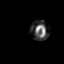
[im 5/14]
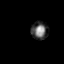
[im 6/14]
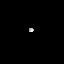
[im 6/14]
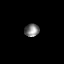
[im 6/14]
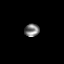
[im 6/14]
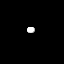
[im 7/14]
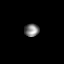
[im 7/14]
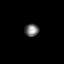
[im 7/14]
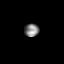
[im 8/14]
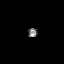
[im 8/14]
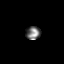
[im 8/14]
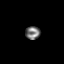
[im 8/14]
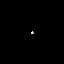
[im 9/14]
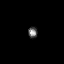
[im 9/14]
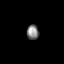
[im 9/14]
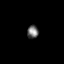
[im 10/14]
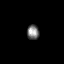
[im 10/14]
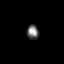
[im 10/14]
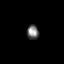
[im 10/14]
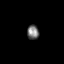
[im 11/14]
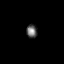
[im 11/14]
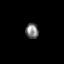
[im 11/14]
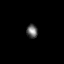
[im 12/14]
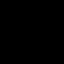
[im 12/14]
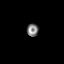
[im 12/14]
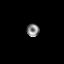
[im 12/14]
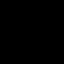
[im 13/14]
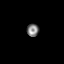
[im 13/14]
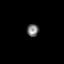
[im 13/14]
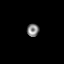
[im 14/14]
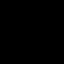
[im 14/14]
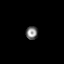
[im 14/14]
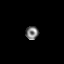
[im 14/14]
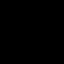

[60 of 60 positions shown; findings below may reference images not displayed]

FINDINGS: Pharmacological Stress Electrocardiogram:  The patient's resting heart rate was 61 bpm and the
resting blood pressure was 230/98.  The patient?s peak stress heart rate was 87 bpm and the peak
stress blood pressure was 180/80.  The patient experienced shortness of breath and lightheadedness
which resolved in the recovery phase.

The resting ECG shows Sinus rhythm with T wave inversion in V1-V3.  Following Regadenoson infusion
there are no new diagnostic ECG changes.
CONCLUSION: Pharmacologic stress ECG is negative for ischemia.

Pulmonary-to-Myocardial Count Ratio:  0.25  (normal = or < 0.52).

Scintigraphic (planar/tomographic):  Planar images reveal the left ventricular cavity is normal in
size.  There is normal pulmonary tracer uptake.  There is no significant attenuation present.  No
transient ischemic dilation is present.

Tomographic images were reconstructed in three orthogonal views.  There is normal homogenous uptake
of thallium in all myocardial segments.  There are no perfusion defects.  All myocardial segments
appear viable.

Polar coordinate map identifies no perfusion abnormalities.

TID Ratio:  1.2  (normal <1.36).

Summed Stress Score:  0   , Summed Rest Score:  0

Regional Wall Thickening and Motion Post Stress:   There is normal left ventricular wall motion and
thickening of all myocardial segments.

Left Ventricular Ejection Fraction (post stress, in the resting state) =  63 %.

Left Ventricular End Diastolic Volume: 52 mL

SUMMARY/OPINION:  This study is normal with no evidence of significant myocardial ischemia. Left
ventricular systolic function is normal. There are no high risk prognostic indicators present.  The
pharmacologic ECG portion of the study is negative for ischemia.

There are no prior studies available for comparison.
In aggregate the current study is low risk in regards to predicted annual cardiovascular mortality
rate.

[09/09/2018 [DATE] - UZEH, BODIONG.]

Tech Notes:

## 2020-03-07 ENCOUNTER — Encounter: Admit: 2020-03-07 | Discharge: 2020-03-07 | Payer: MEDICARE

## 2020-03-24 IMAGING — US BREASTLT
1 series · 14 of 25 positions shown · non-contrast
Comparison: none

[Series 1: us breast left complete · 14 of 37 slices shown]
[im 1/37]
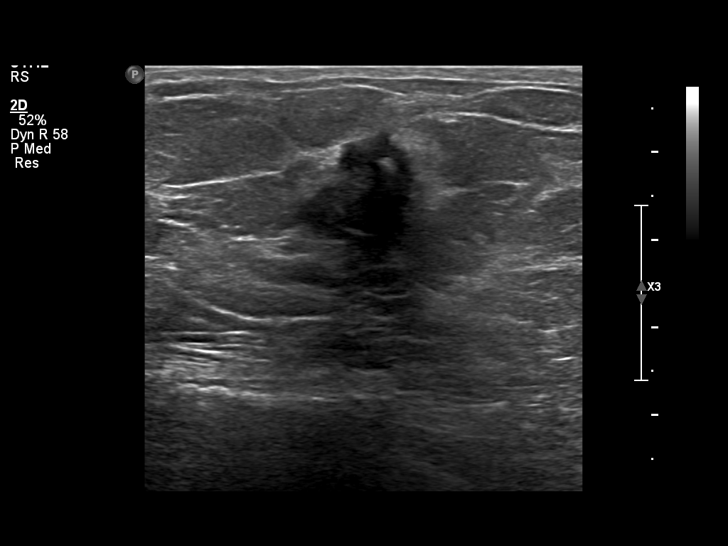
[im 4/37]
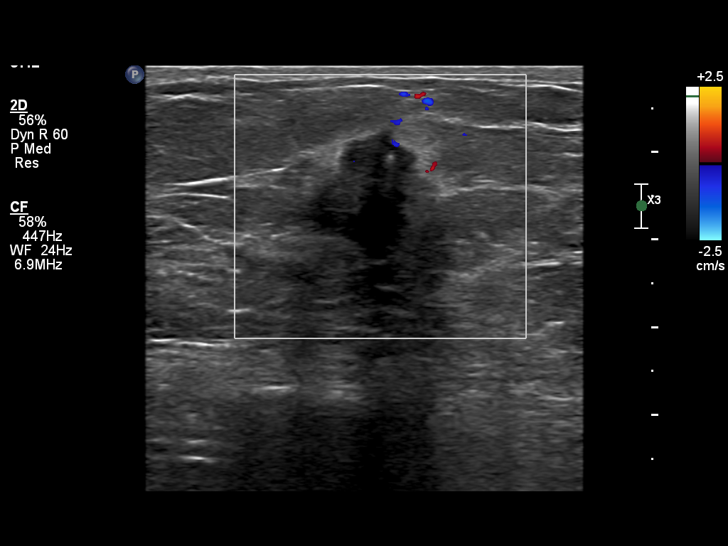
[im 7/37]
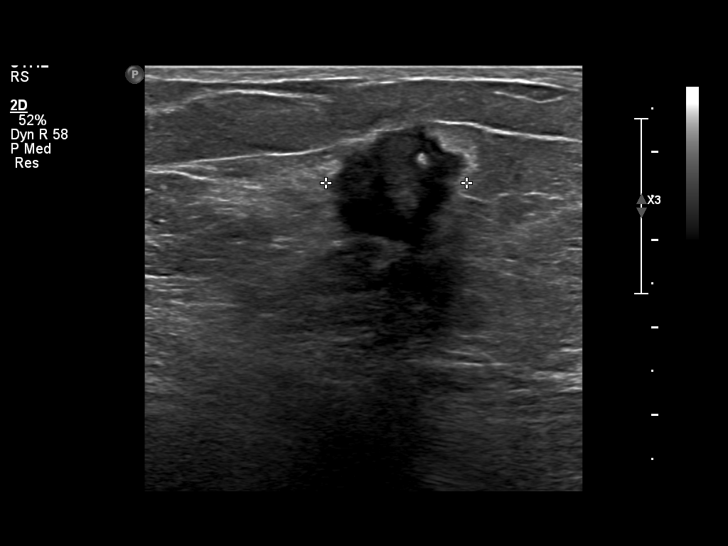
[im 10/37]
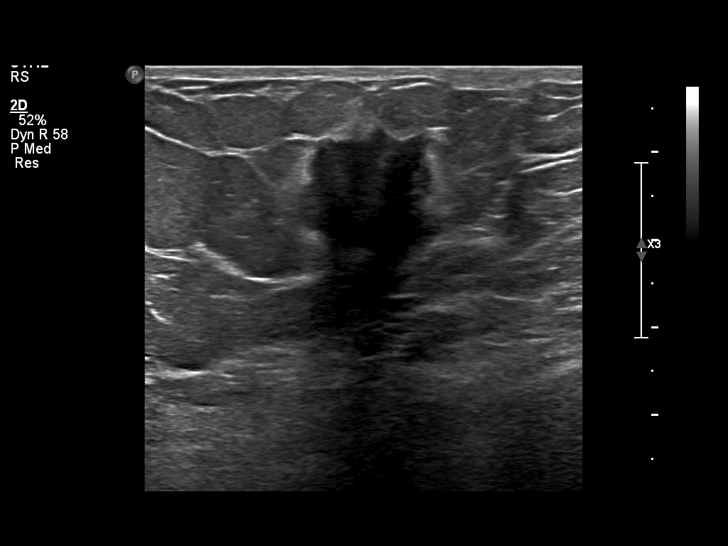
[im 13/37]
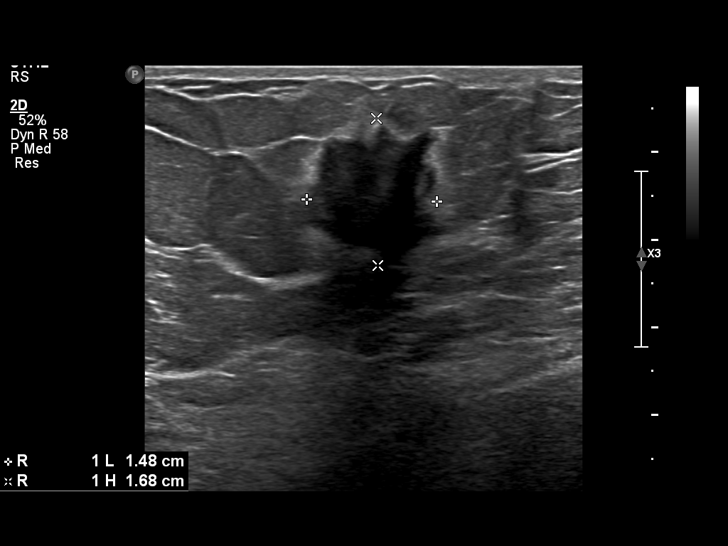
[im 14/37]
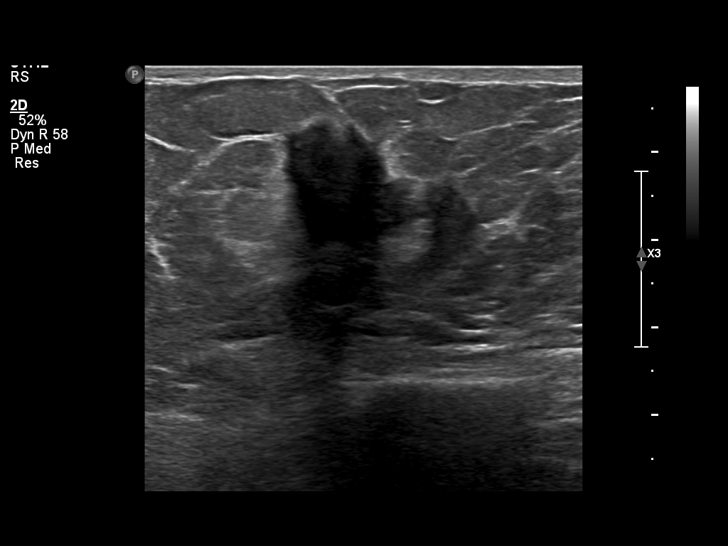
[im 17/37]
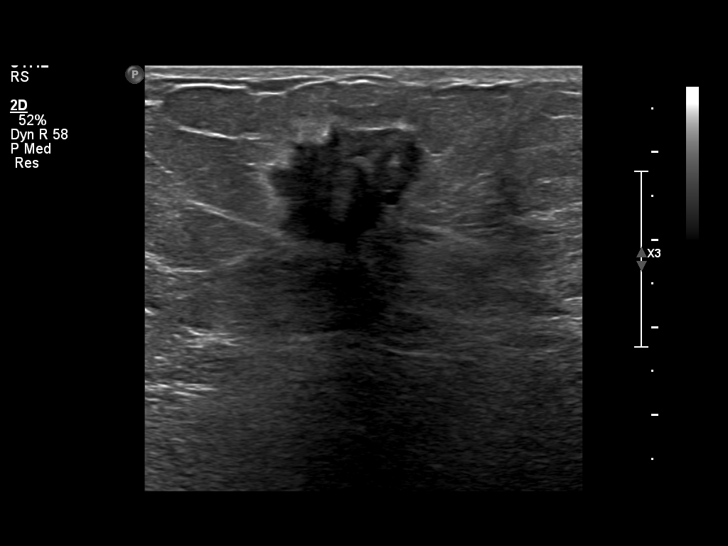
[im 20/37]
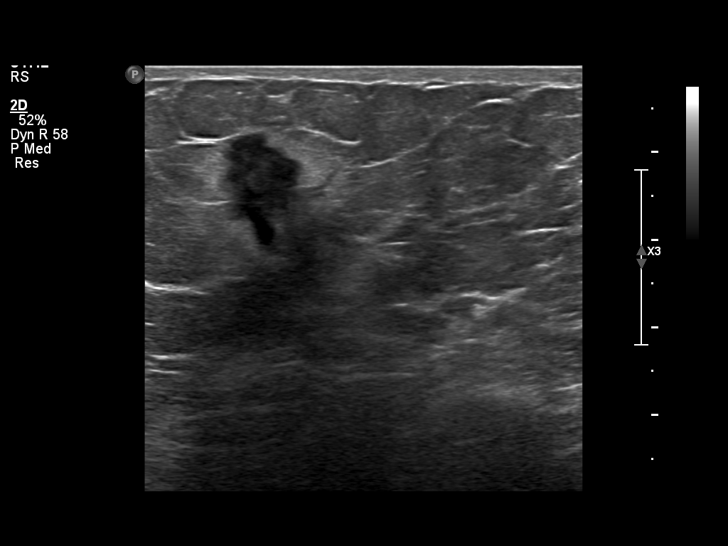
[im 23/37]
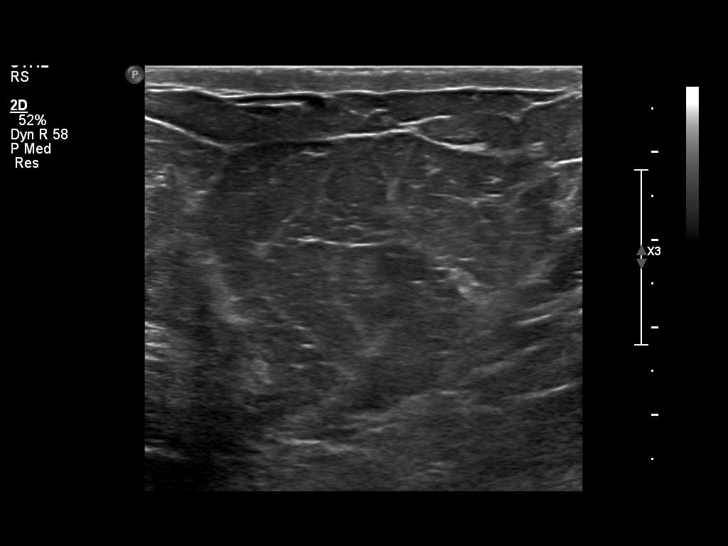
[im 25/37]
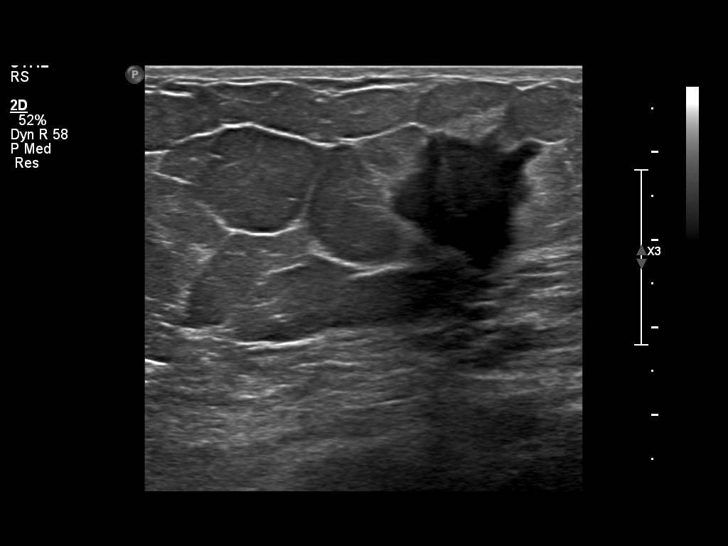
[im 28/37]
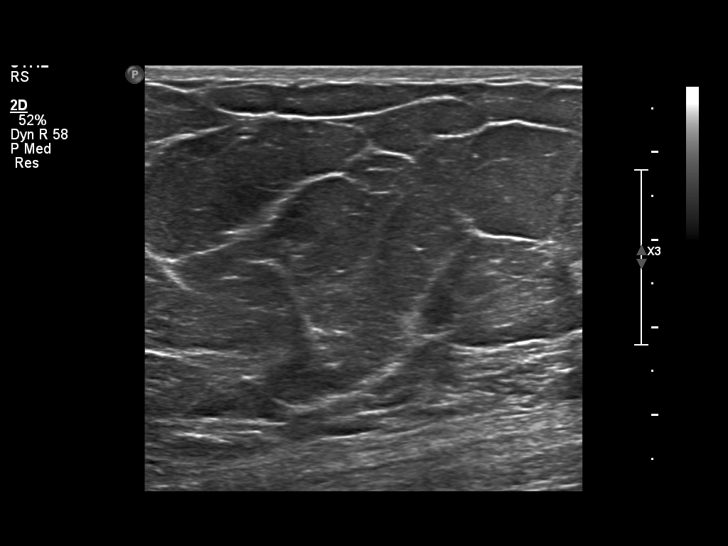
[im 31/37]
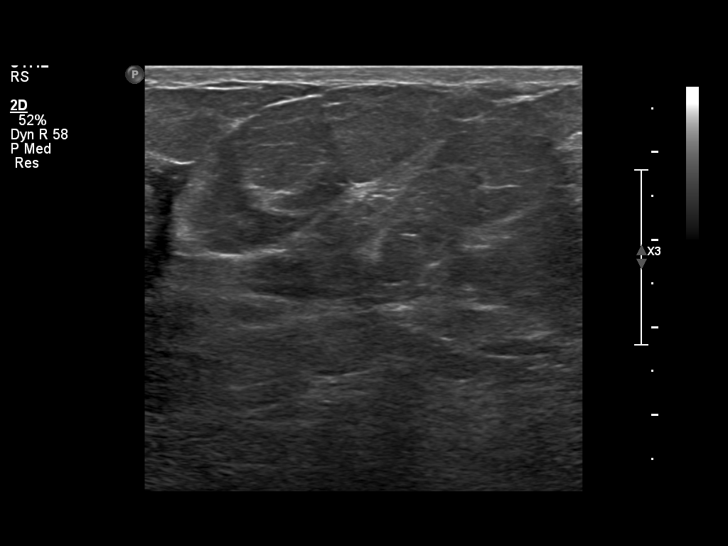
[im 34/37]
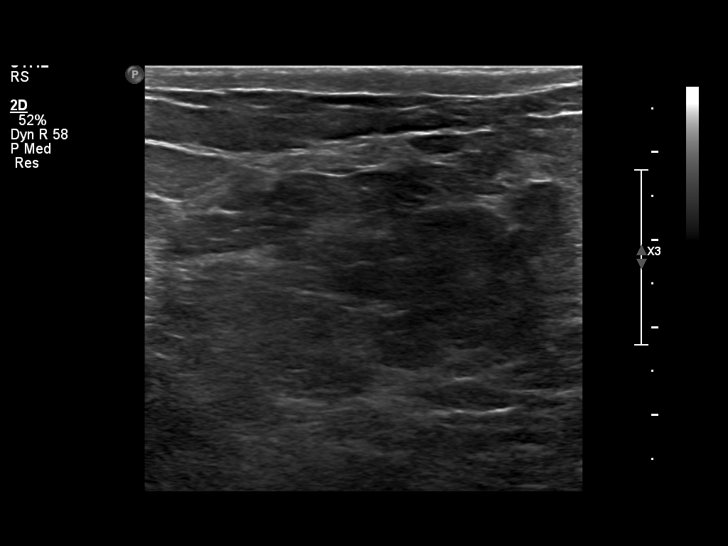
[im 37/37]
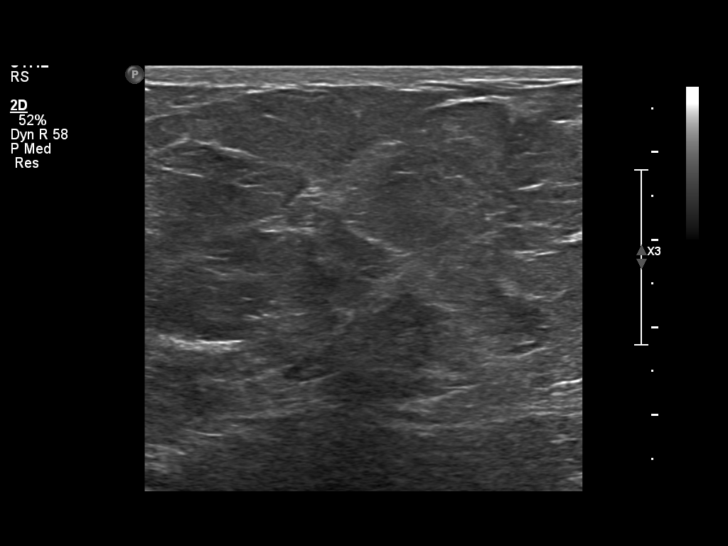

[14 of 25 positions shown; findings below may reference images not displayed]

DIAGNOSTIC STUDIES

EXAM

Ultrasound left breast

INDICATION

left breast mass
jl

TECHNIQUE

Sonographic images of the 7 o'clock left breast were obtained in the area of mammographic concern

COMPARISONS

Mammogram September 29, 2018

FINDINGS

In the area of concern, there is demonstration of a spiculated mass measuring 1.5 x 1.7 x 1.6 cm in
the 8 o'clock position approximately 6 cm from the nipple. The remaining breast echotexture is
unremarkable. There is no axillary adenopathy.

IMPRESSION

1. BI-RADS 5, highly suspicious. Further evaluation with ultrasound-guided biopsy of the 9 o'clock
left breast is recommended for improved characterization of spiculated mass.

Tech Notes:

jl

## 2020-03-24 IMAGING — MG MAMMOGRAM, DIGITAL BILAT,DX
1 series · 5 of 5 positions shown · non-contrast
Comparison: none

[Series 2: R CC · right · 5 of 5 slices shown]
[im 1/5]
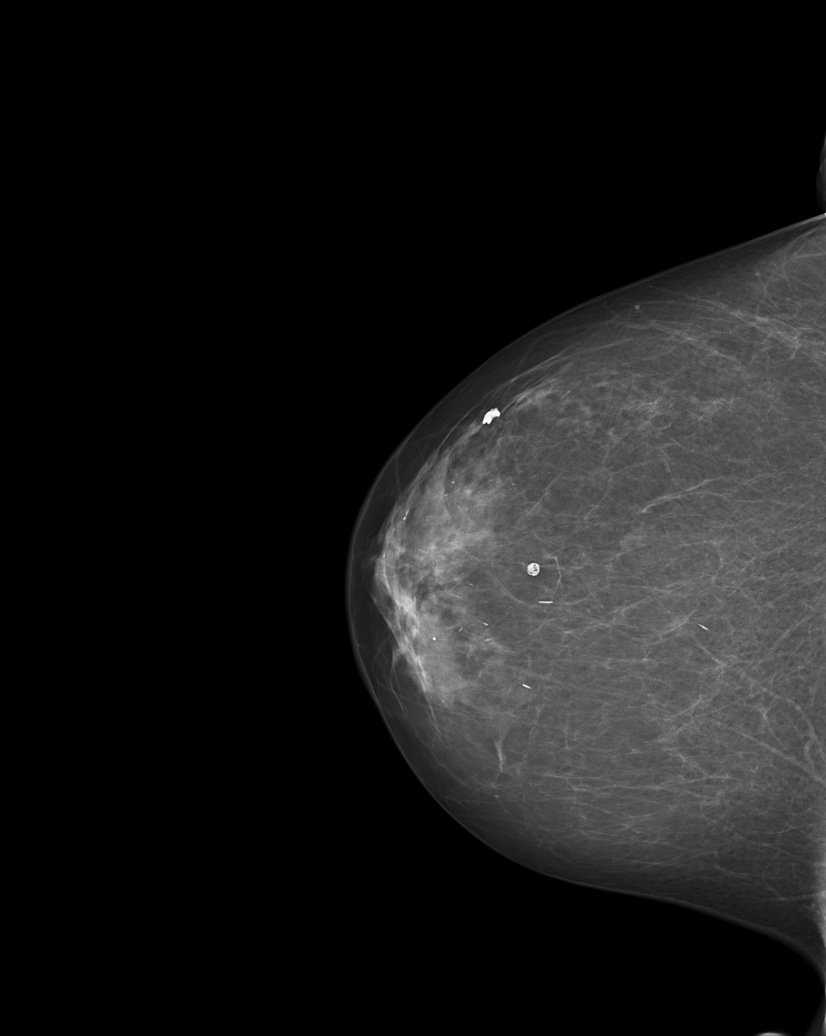
[im 2/5]
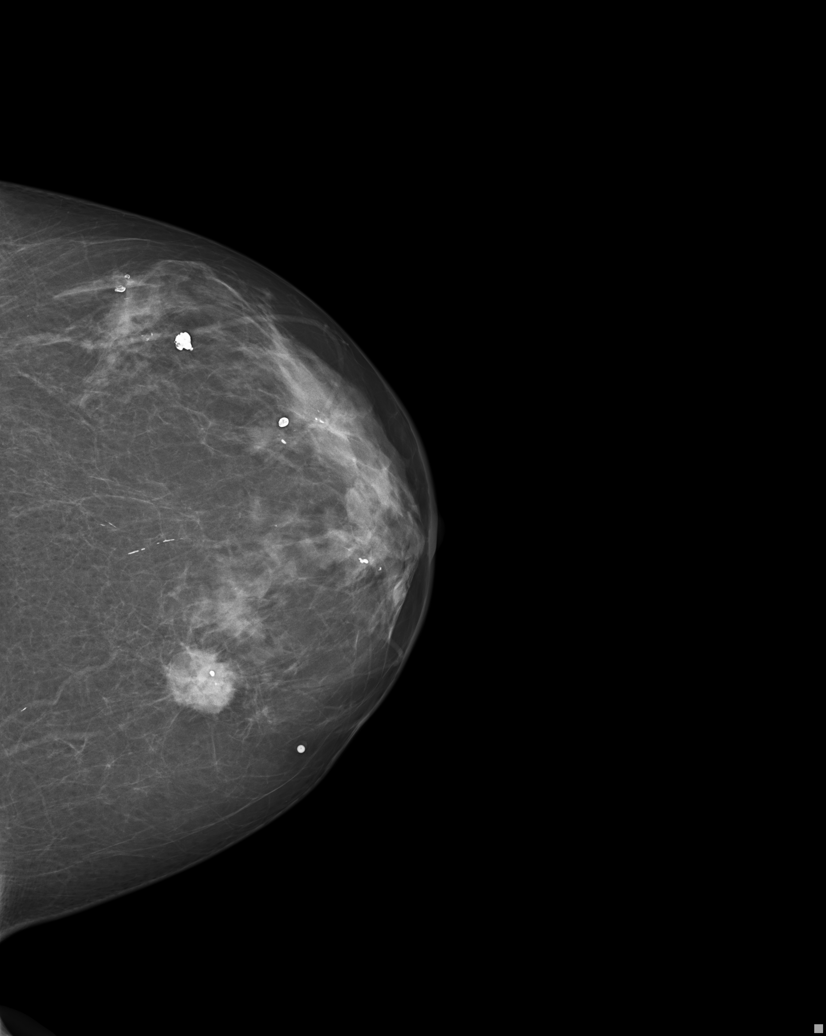
[im 3/5]
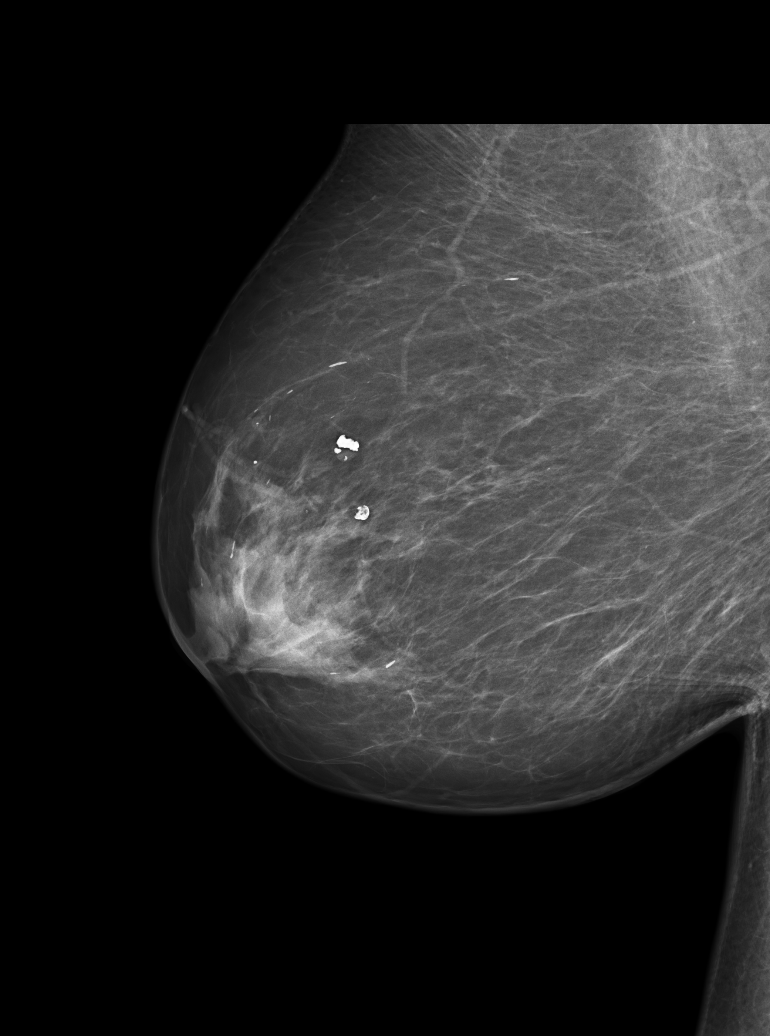
[im 4/5]
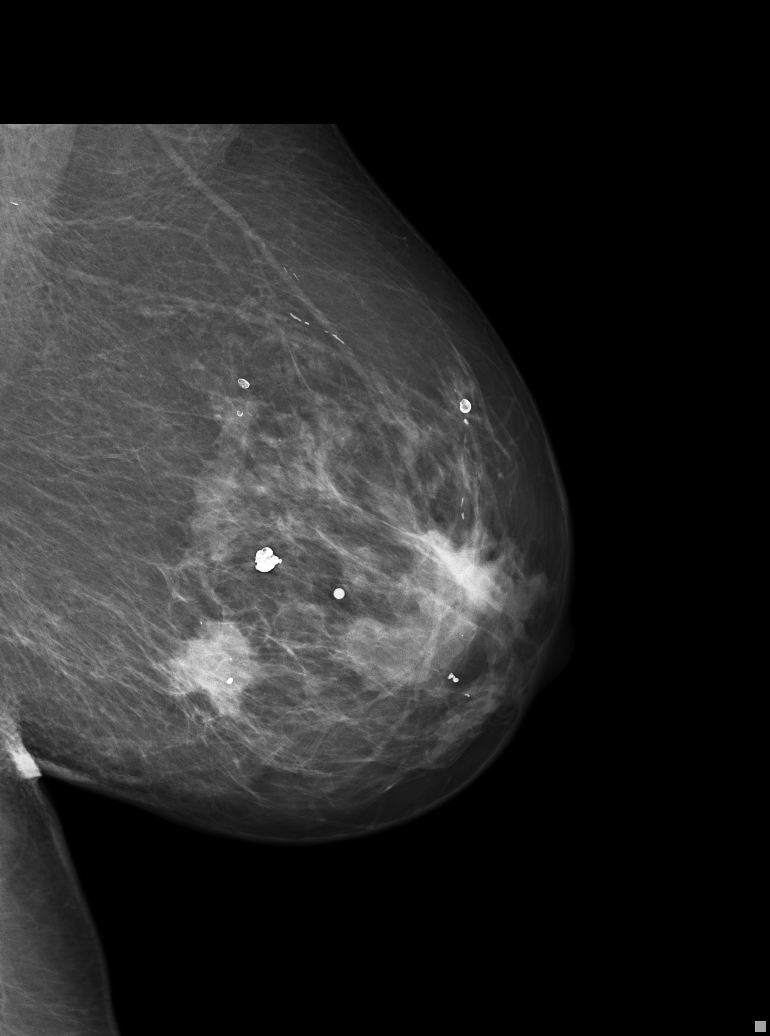
[im 5/5]
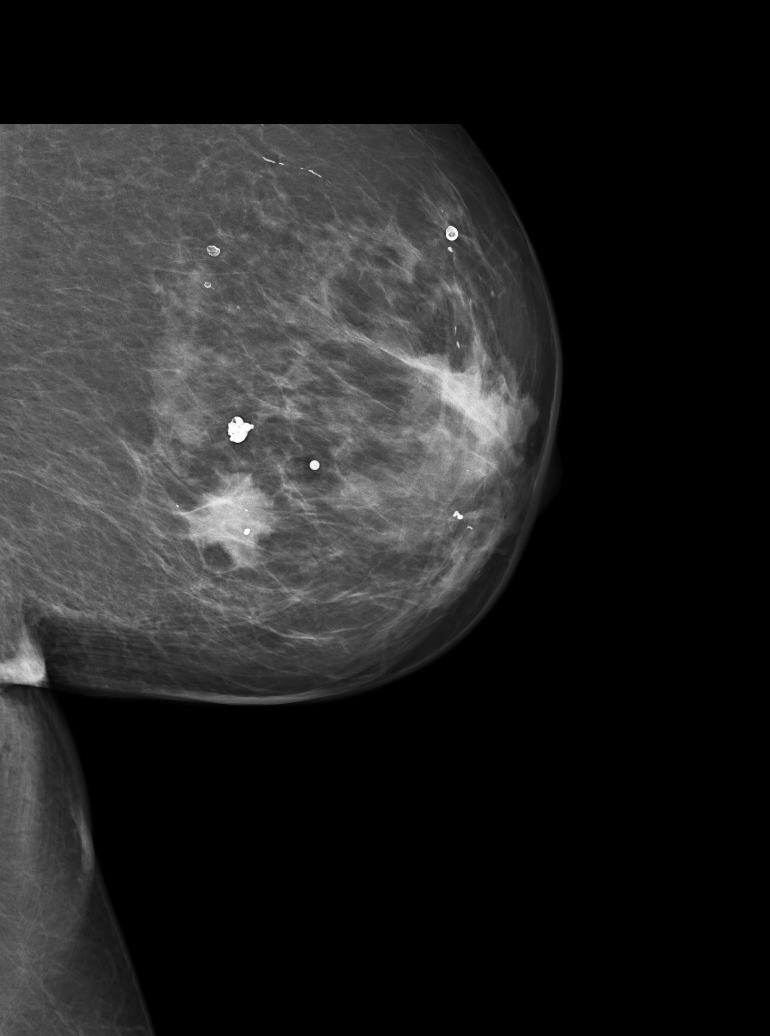

[5 of 5 positions shown; findings below may reference images not displayed]

DIAGNOSTIC STUDIES

EXAM

Diagnostic bilateral breast mammogram

INDICATION

left breast mass, annual screening for right breas
PT STATED ER DR FOUND LUMP  IN LT BREAST RECENTLY.  NULLIPARITY.  DX BIL. AB (2D - LIMITED NECK
ROM). PRIORS: LIBERTY MO.

TECHNIQUE

Digital craniocaudal and medial lateral oblique images of the bilateral breasts were obtained and
reviewed with computer-aided detection

COMPARISONS

None

FINDINGS

The breasts demonstrate heterogeneously dense pattern which somewhat limits the sensitivity of
mammography. There are benign calcifications seen in the bilateral breasts. Within the medial
inferior left breast in the 8 o'clock position approximately 6.3 cm from the nipple there is
demonstration of a spiculated mass.

IMPRESSION

BI-RADS 0, further evaluation of and 8-9 o'clock spiculated mass 6.3 cm from the nipple within the
left breast with ultrasound is recommended.

Tech Notes:

## 2020-11-16 ENCOUNTER — Encounter: Admit: 2020-11-16 | Discharge: 2020-11-16 | Payer: MEDICARE

## 2020-11-23 ENCOUNTER — Encounter: Admit: 2020-11-23 | Discharge: 2020-11-23 | Payer: MEDICARE

## 2020-11-23 DIAGNOSIS — E119 Type 2 diabetes mellitus without complications: Secondary | ICD-10-CM

## 2020-11-23 DIAGNOSIS — N289 Disorder of kidney and ureter, unspecified: Secondary | ICD-10-CM

## 2020-11-23 DIAGNOSIS — I10 Essential (primary) hypertension: Secondary | ICD-10-CM

## 2020-11-23 DIAGNOSIS — R0989 Other specified symptoms and signs involving the circulatory and respiratory systems: Secondary | ICD-10-CM

## 2020-11-23 DIAGNOSIS — M47812 Spondylosis without myelopathy or radiculopathy, cervical region: Secondary | ICD-10-CM

## 2020-11-23 DIAGNOSIS — T887XXA Unspecified adverse effect of drug or medicament, initial encounter: Secondary | ICD-10-CM

## 2020-11-23 DIAGNOSIS — Z136 Encounter for screening for cardiovascular disorders: Secondary | ICD-10-CM

## 2020-11-23 DIAGNOSIS — Z8249 Family history of ischemic heart disease and other diseases of the circulatory system: Secondary | ICD-10-CM

## 2020-11-23 MED ORDER — TOPICAINE 4 % TP GEL
1 g | Freq: Three times a day (TID) | TOPICAL | 1 refills | 30.00000 days | Status: AC | PRN
Start: 2020-11-23 — End: ?

## 2020-11-23 MED ORDER — XOLIDO 2 % TP CREA
1 g | TOPICAL | 1 refills | 30.00000 days | Status: DC | PRN
Start: 2020-11-23 — End: 2020-11-23

## 2020-11-28 ENCOUNTER — Encounter: Admit: 2020-11-28 | Discharge: 2020-11-28 | Payer: MEDICARE

## 2020-12-07 ENCOUNTER — Encounter: Admit: 2020-12-07 | Discharge: 2020-12-07 | Payer: MEDICARE

## 2020-12-07 ENCOUNTER — Ambulatory Visit: Admit: 2020-12-07 | Discharge: 2020-12-07 | Payer: MEDICARE

## 2020-12-07 DIAGNOSIS — G4486 Cervicogenic headache: Secondary | ICD-10-CM

## 2020-12-07 DIAGNOSIS — M5481 Occipital neuralgia: Secondary | ICD-10-CM

## 2020-12-07 DIAGNOSIS — M47812 Spondylosis without myelopathy or radiculopathy, cervical region: Secondary | ICD-10-CM

## 2020-12-07 DIAGNOSIS — I10 Essential (primary) hypertension: Secondary | ICD-10-CM

## 2020-12-07 DIAGNOSIS — E119 Type 2 diabetes mellitus without complications: Secondary | ICD-10-CM

## 2020-12-07 DIAGNOSIS — M7918 Myalgia, other site: Secondary | ICD-10-CM

## 2020-12-07 MED ORDER — GABAPENTIN 300 MG PO CAP
300 mg | ORAL_CAPSULE | Freq: Every evening | ORAL | 3 refills | Status: AC
Start: 2020-12-07 — End: ?

## 2021-01-17 ENCOUNTER — Encounter: Admit: 2021-01-17 | Discharge: 2021-01-17 | Payer: MEDICARE

## 2021-01-17 ENCOUNTER — Ambulatory Visit: Admit: 2021-01-17 | Discharge: 2021-01-17 | Payer: MEDICARE

## 2021-01-17 DIAGNOSIS — G4486 Cervicogenic headache: Secondary | ICD-10-CM

## 2021-01-17 DIAGNOSIS — M5481 Occipital neuralgia: Secondary | ICD-10-CM

## 2021-01-17 DIAGNOSIS — M47812 Spondylosis without myelopathy or radiculopathy, cervical region: Secondary | ICD-10-CM

## 2021-01-17 DIAGNOSIS — M7918 Myalgia, other site: Secondary | ICD-10-CM

## 2021-01-17 NOTE — Progress Notes
Obtained patient's verbal consent to treat them and their agreement to [facility] financial policy and NPP via this telehealth visit during the [facility] Emergency  Todays visit took place via telephone. Total time 15 minutes.  SPINE CENTER CLINIC NOTE       SUBJECTIVE:  chronic neck pain  Last seen 1 mo ago   For TPI and OCNB   Reports >75% relief of pain     Patient reports that she has had left-sided neck pain for years but recently worse in the last 8 months  Pain is mostly on the left with positive headaches and occasional shoulder pain  Pain is constant described as shooting and burning  Pain affects left neck base of the head temples and her ear  Heat and rest helps  Aggravated by walking movement bending of the neck  Pain is rated 5 out of 10 on numeric scale  ?  TRX   Left OCNB and TPI 10/20 with >75% relief   Past formal PT at her assisted living facility but felt this made the pain worse  Patient has undergone chiropractic care with limited relief  No prior spinal surgery  ?  Meds  Started gabapentin 300mg  at [facility]- tolerates this and finds this has helped  hydrocodone for pain but does not feel this is significantly helping  Acetaminophen  Diclofenac  Tizanidine  ?  Past tried tramadol       Review of Systems    Current Outpatient Medications:   ?  acetaminophen (TYLENOL) 500 mg tablet, Take 1,000 mg by mouth twice daily. Max of 4,000 mg of acetaminophen in 24 hours., Disp: , Rfl:   ?  anastrozole (ARIMIDEX) 1 mg tablet, Take 1 mg by mouth daily., Disp: , Rfl:   ?  carboxymethylcellulose sodium (REFRESH TEARS) 0.5 % eye drop, Apply 1 drop to both eyes every 6 hours as needed., Disp: , Rfl:   ?  cloNIDine (CATAPRES-TTS-1) 0.1 mg/day patch, Apply one patch to top of skin as directed every 7 days. Apply one patch along with one 0.3 mg clonidine patch for a total of 0.4 mg., Disp: 4 patch, Rfl: 3  ?  cloNIDine (CATAPRES-TTS-3) 0.3 mg/day patch, Apply one patch to top of skin as directed every 7 days., Disp: 12 patch, Rfl: 3  ?  cloNIDine HCL (CATAPRES) 0.1 mg tablet, Take 0.1 mg by mouth daily as needed., Disp: , Rfl:   ?  Dextromethorphan-Guaifenesin (CORICIDIN HBP CHEST CONG-COUGH) 10-200 mg cap, Take 1 capsule by mouth every 6 hours as needed., Disp: , Rfl:   ?  diclofenac (VOLTAREN) 1 % topical gel, Apply 2 g topically to affected area twice daily., Disp: , Rfl:   ?  gabapentin (NEURONTIN) 300 mg capsule, Take one capsule by mouth at bedtime daily., Disp: 90 capsule, Rfl: 3  ?  hydrALAZINE (APRESOLINE) 50 mg tablet, Take one tablet by mouth twice daily., Disp: 180 tablet, Rfl: 3  ?  HYDROcodone/acetaminophen (NORCO) 5/325 mg tablet, Take 1 tablet by mouth every 8 hours as needed for Pain., Disp: , Rfl:   ?  hydrocortisone 1 % topical cream, Apply  topically to affected area as Needed (Apply to ears)., Disp: , Rfl:   ?  Lactoperoxi/Gluc Oxid/Pot Thio (BIOTENE DRY MOUTH MM), by Mucous Membrane route five times daily as needed., Disp: , Rfl:   ?  lidocaine (TOPICAINE) 4 % gel, Apply 1 g topically to affected area three times daily as needed., Disp: 30 g, Rfl: 1  ?  metFORMIN (GLUCOPHAGE) 500 mg tablet, Take 500 mg by mouth daily with breakfast., Disp: , Rfl:   ?  NIFEdipine SR (PROCARDIA-XL) 60 mg tablet, Take 60 mg by mouth daily., Disp: , Rfl:   ?  olmesartan (BENICAR) 40 mg tablet, Take one tablet by mouth daily. Indications: high blood pressure, Disp: 90 tablet, Rfl: 3  ?  PEG 400-Propylene Glycol (SYSTANE (PROPYLENE GLYCOL)) 0.4-0.3 % drop, Place 1 drop into or around eye(s) twice daily., Disp: , Rfl:   ?  potassium chloride (K-DUR) 10 mEq tablet, Take 10 mEq by mouth daily. Take with a meal and a full glass of water., Disp: , Rfl:   ?  SITagliptin (JANUVIA) 50 mg tab, Take 50 mg by mouth daily., Disp: , Rfl:   ?  tiZANidine (ZANAFLEX) 4 mg tablet, Take 4 mg by mouth twice daily., Disp: , Rfl:   ?  torsemide (DEMADEX) 20 mg tablet, Take 20 mg by mouth daily., Disp: , Rfl:   ?  traMADoL (ULTRAM) 50 mg tablet, Take 50 mg by mouth every 6 hours., Disp: , Rfl:   ?  VENTOLIN HFA 90 mcg/actuation aerosol inhaler, Inhale 2 puffs by mouth into the lungs every 8 hours as needed for Wheezing or Shortness of Breath. Shake well before use. , Disp: , Rfl:   ?  vit A/vit C/vit E/zinc/copper (PRESERVISION AREDS PO), Take 1 capsule by mouth twice daily., Disp: , Rfl:   Allergies   Allergen Reactions   ? Shellfish Containing Products UNKNOWN     Physical Exam  There were no vitals filed for this visit.        There is no height or weight on file to calculate BMI.         IMPRESSION:  1. Cervico-occipital neuralgia of left side    2. Cervicogenic headache    3. 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

## 2021-01-18 ENCOUNTER — Encounter: Admit: 2021-01-18 | Discharge: 2021-01-18 | Payer: MEDICARE

## 2021-01-18 MED ORDER — GABAPENTIN 300 MG PO CAP
600 mg | ORAL_CAPSULE | Freq: Every evening | ORAL | 3 refills | Status: AC
Start: 2021-01-18 — End: ?

## 2021-01-25 ENCOUNTER — Encounter: Admit: 2021-01-25 | Discharge: 2021-01-25 | Payer: MEDICARE

## 2021-01-25 ENCOUNTER — Ambulatory Visit: Admit: 2021-01-25 | Discharge: 2021-01-25 | Payer: MEDICARE

## 2021-01-25 DIAGNOSIS — E119 Type 2 diabetes mellitus without complications: Secondary | ICD-10-CM

## 2021-01-25 DIAGNOSIS — M5481 Occipital neuralgia: Secondary | ICD-10-CM

## 2021-01-25 DIAGNOSIS — G4486 Cervicogenic headache: Secondary | ICD-10-CM

## 2021-01-25 DIAGNOSIS — M7918 Myalgia, other site: Secondary | ICD-10-CM

## 2021-01-25 DIAGNOSIS — M47812 Spondylosis without myelopathy or radiculopathy, cervical region: Secondary | ICD-10-CM

## 2021-01-25 DIAGNOSIS — I10 Essential (primary) hypertension: Secondary | ICD-10-CM

## 2021-01-25 MED ORDER — TRIAMCINOLONE ACETONIDE 40 MG/ML IJ SUSP
40 mg | Freq: Once | INTRAMUSCULAR | 0 refills | Status: CP | PRN
Start: 2021-01-25 — End: ?
  Administered 2021-01-25: 14:00:00 40 mg via INTRAMUSCULAR

## 2021-01-25 MED ORDER — BUPIVACAINE (PF) 0.25 % (2.5 MG/ML) IJ SOLN
3 mL | Freq: Once | INTRAMUSCULAR | 0 refills | Status: CP | PRN
Start: 2021-01-25 — End: ?
  Administered 2021-01-25: 14:00:00 3 mL via INTRAMUSCULAR

## 2021-01-25 MED ORDER — BUPIVACAINE (PF) 0.25 % (2.5 MG/ML) IJ SOLN
4 mL | Freq: Once | INTRAMUSCULAR | 0 refills | Status: CP | PRN
Start: 2021-01-25 — End: ?
  Administered 2021-01-25: 14:00:00 4 mL via INTRAMUSCULAR

## 2021-01-25 NOTE — Procedures
Attending Surgeon: Blanchard Kelch Jasira Robinson, APRN-NP    Anesthesia: Local    Pre-Procedure Diagnosis:   1. Cervico-occipital neuralgia of left side    2. Cervicogenic headache    3. 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 Myalgia, other site        Post-Procedure Diagnosis:   1. Cervico-occipital neuralgia of left side    2. Cervicogenic headache    3. 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 Myalgia, other site        Pain Score: Three    [facility]  Nerve: occipital  Laterality: left   on 01/25/2021 8:20 AM    Consent:   Consent obtained: written  Consent given by: patient  Alternatives discussed: alternative treatment  Discussed with patient the purpose of the treatment/procedure, other ways of treating my condition, including no treatment/ procedure and the risks and benefits of the alternatives. Patient has decided to proceed with treatment/procedure.        Universal Protocol:  Relevant documents: relevant documents present and verified  Site marked: the operative site was marked  Patient identity confirmed: Patient identify confirmed verbally with patient.        Time out: Immediately prior to procedure a "time out" was called to verify the correct patient, procedure, equipment, support staff and site/side marked as required        Procedures Details:   Indications: Pain Relief (occipital neuralgia)  Needle size: 27 G  Guidance: anatomical  Medications administered: 40 mg triamcinolone acetonide 40 mg/mL; 3 mL bupivacaine PF 0.25 %  Outcome: Pain improved  Patient tolerance: tolerated well, no immediate complications          Estimated blood loss: none or minimal  Specimens: none  Patient tolerated the procedure well with no immediate complications. Pressure was applied, and hemostasis was accomplished.

## 2021-01-25 NOTE — Procedures
Attending Surgeon: Blanchard Kelch Chiyeko Ferre, APRN-NP    Anesthesia: Local    Pre-Procedure Diagnosis:   1. Cervico-occipital neuralgia of left side    2. Cervicogenic headache    3. 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 Myalgia, other site        Post-Procedure Diagnosis:   1. Cervico-occipital neuralgia of left side    2. Cervicogenic headache    3. 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 Myalgia, other site        Pain Score: Three     AMB SPINE TRIGGER POINT INJECTION  Locations: L upper trapezius and L cervical/thoracic/lumbar paraspinal  Consent:   Consent obtained: written  Consent given by: patient  Alternatives discussed: alternative treatment     Universal Protocol:  Relevant documents: relevant documents present and verified  Patient identity confirmed: Patient identify confirmed verbally with patient.          Procedures Details:   Indications: cervicalgia, pain and myalgiaPrep: alcohol  Needle size: 27 G  Number of muscles: 1 or 2  Approach: posterior  Medications administered: 4 mL bupivacaine PF 0.25 %  Patient tolerance: Patient tolerated the procedure well with no immediate complications. Pressure was applied, and hemostasis was accomplished.          Estimated blood loss: none or minimal  Specimens: none  Patient tolerated the procedure well with no immediate complications. Pressure was applied, and hemostasis was accomplished.

## 2021-01-25 NOTE — Progress Notes
SPINE CENTER CLINIC NOTE       SUBJECTIVE:   Chronic neck pain   Left sided   Affects left occipital area and lateral temple and head   Pain constant   She was seen for OCNB and TPI along this are with>75% relief in 10/20  Injections were able to help her get more functional   Pain gradually coming back   She comes in today to repeat trx     Cont to use heat which helps some          Review of Systems    Current Outpatient Medications:   ?  acetaminophen (TYLENOL) 500 mg tablet, Take 1,000 mg by mouth twice daily. Max of 4,000 mg of acetaminophen in 24 hours., Disp: , Rfl:   ?  anastrozole (ARIMIDEX) 1 mg tablet, Take 1 mg by mouth daily., Disp: , Rfl:   ?  carboxymethylcellulose sodium (REFRESH TEARS) 0.5 % eye drop, Apply 1 drop to both eyes every 6 hours as needed., Disp: , Rfl:   ?  cloNIDine (CATAPRES-TTS-1) 0.1 mg/day patch, Apply one patch to top of skin as directed every 7 days. Apply one patch along with one 0.3 mg clonidine patch for a total of 0.4 mg., Disp: 4 patch, Rfl: 3  ?  cloNIDine (CATAPRES-TTS-3) 0.3 mg/day patch, Apply one patch to top of skin as directed every 7 days., Disp: 12 patch, Rfl: 3  ?  cloNIDine HCL (CATAPRES) 0.1 mg tablet, Take 0.1 mg by mouth daily as needed., Disp: , Rfl:   ?  Dextromethorphan-Guaifenesin (CORICIDIN HBP CHEST CONG-COUGH) 10-200 mg cap, Take 1 capsule by mouth every 6 hours as needed., Disp: , Rfl:   ?  diclofenac (VOLTAREN) 1 % topical gel, Apply 2 g topically to affected area twice daily., Disp: , Rfl:   ?  gabapentin (NEURONTIN) 300 mg capsule, Take two capsules by mouth at bedtime daily., Disp: 90 capsule, Rfl: 3  ?  hydrALAZINE (APRESOLINE) 50 mg tablet, Take one tablet by mouth twice daily., Disp: 180 tablet, Rfl: 3  ?  HYDROcodone/acetaminophen (NORCO) 5/325 mg tablet, Take 1 tablet by mouth every 8 hours as needed for Pain., Disp: , Rfl:   ?  hydrocortisone 1 % topical cream, Apply  topically to affected area as Needed (Apply to ears)., Disp: , Rfl:   ? Lactoperoxi/Gluc Oxid/Pot Thio (BIOTENE DRY MOUTH MM), by Mucous Membrane route five times daily as needed., Disp: , Rfl:   ?  lidocaine (TOPICAINE) 4 % gel, Apply 1 g topically to affected area three times daily as needed., Disp: 30 g, Rfl: 1  ?  metFORMIN (GLUCOPHAGE) 500 mg tablet, Take 500 mg by mouth daily with breakfast., Disp: , Rfl:   ?  NIFEdipine SR (PROCARDIA-XL) 60 mg tablet, Take 60 mg by mouth daily., Disp: , Rfl:   ?  olmesartan (BENICAR) 40 mg tablet, Take one tablet by mouth daily. Indications: high blood pressure, Disp: 90 tablet, Rfl: 3  ?  PEG 400-Propylene Glycol (SYSTANE (PROPYLENE GLYCOL)) 0.4-0.3 % drop, Place 1 drop into or around eye(s) twice daily., Disp: , Rfl:   ?  potassium chloride (K-DUR) 10 mEq tablet, Take 10 mEq by mouth daily. Take with a meal and a full glass of water., Disp: , Rfl:   ?  SITagliptin (JANUVIA) 50 mg tab, Take 50 mg by mouth daily., Disp: , Rfl:   ?  tiZANidine (ZANAFLEX) 4 mg tablet, Take 4 mg by mouth twice daily., Disp: , Rfl:   ?  torsemide (DEMADEX) 20 mg tablet, Take 20 mg by mouth daily., Disp: , Rfl:   ?  traMADoL (ULTRAM) 50 mg tablet, Take 50 mg by mouth every 6 hours., Disp: , Rfl:   ?  VENTOLIN HFA 90 mcg/actuation aerosol inhaler, Inhale 2 puffs by mouth into the lungs every 8 hours as needed for Wheezing or Shortness of Breath. Shake well before use. , Disp: , Rfl:   ?  vit A/vit C/vit E/zinc/copper (PRESERVISION AREDS PO), Take 1 capsule by mouth twice daily., Disp: , Rfl:   Allergies   Allergen Reactions   ? Shellfish Containing Products UNKNOWN     Physical Exam  Vitals:    01/25/21 0829   BP: (!) 175/84  Comment: asymptomatic   Pulse: 84   Temp: 36.8 ?C (98.2 ?F)   SpO2: 97%   PainSc: Three   Weight: 70.3 kg (155 lb)   Height: 152.4 cm (5')        Pain Score: Three  Body mass index is 30.27 kg/m?Marland Kitchen  General: Alert, oriented, mild distress  HEENT: normocephalic  NECK: TTP along left paracerv muscles. Limited ROM. Occipital ridge, left sided tenderness  Resp: Non labored breathing, no distress  Cardio: Pedal pulses palpable bilaterally, mild lower extremity edema  MS: TTP in lumbar region of spine and paraspinal muscles.  NEURO: Cranial nerves II-XII intact. Motor strength adequate, sensory exams with left lesser occipital scalp allodynia. Gait not assessed. WC assisted.   Behavior: Calm, cooperative, behavior and speech normal.            IMPRESSION:  1. Cervico-occipital neuralgia of left side    2. Cervicogenic headache    3. 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 Myalgia, other site          PLAN:    Repeat OCNB today   Repeat paracervical TPI   Continue gabapentin, consider increasing to 900mg  day  F.u in 6 weeks telehealth to eval trx outcomes.

## 2021-01-26 ENCOUNTER — Encounter: Admit: 2021-01-26 | Discharge: 2021-01-26 | Payer: MEDICARE

## 2021-01-26 MED ORDER — GABAPENTIN 300 MG PO CAP
ORAL_CAPSULE | Freq: Every evening | 2 refills
Start: 2021-01-26 — End: ?

## 2021-01-30 ENCOUNTER — Encounter: Admit: 2021-01-30 | Discharge: 2021-01-30 | Payer: MEDICARE

## 2021-01-30 MED ORDER — GABAPENTIN 300 MG PO CAP
ORAL_CAPSULE | Freq: Every evening | 3 refills | Status: AC
Start: 2021-01-30 — End: ?

## 2021-03-01 ENCOUNTER — Encounter: Admit: 2021-03-01 | Discharge: 2021-03-01 | Payer: MEDICARE

## 2021-03-01 NOTE — Telephone Encounter
Received a call from Andrea Board, RN at [facility] requesting a callback as Andrea Branch is having low blood pressure this morning.  Called Andrea Branch to discuss.  She states that this morning, Andrea Branch took her BP and it was 176/79, HR 76.  She took her morning meds which include: Hydralazine 50mg , Nifedipine 60 mg, torsemide 20mg  and tizandine 4mg .  She also took tramadol 50mg  this morning about 1 hour prior to taking her scheduled medications.  Andrea Branch was visiting with social services this morning and when she stood up to leave, she became really dizzy and "almost walked into the wall".  They checked her BP and it was 72/44.  They got her back to her room and put her in a recliner and rechecked her BP and it was 70/40, HR 70.  They said this has happened 2 or 3 times previously.      Andrea Branch is still feeling dizzy.  Andrea Branch denies any pre-syncope or syncopal events.  No other complaints noted.  They are continuing to monitor Andrea Branch.  Discussed with Andrea Branch that the combination of pain medication and muscle relaxer with the BP medication may be causing the hypotension.  Recommended that if BP remains low and pt continues to be symptomatic, they take Andrea Branch to the emergency department for evaluation and treatment.   Andrea Branch verbalized understanding.    Will discuss with Andrea Branch for any additional recommendations.

## 2021-03-01 NOTE — Telephone Encounter
Follow up Nurse states pt feeling better bp 90's/50's pulse of 70's no complaints at this time.  Nurse was instructed bp medication could be held if hypotensive.  Continue to monitor bp call if continued issues.

## 2021-03-02 ENCOUNTER — Encounter: Admit: 2021-03-02 | Discharge: 2021-03-02 | Payer: MEDICARE

## 2021-03-02 MED ORDER — HYDRALAZINE 50 MG PO TAB
50 mg | ORAL_TABLET | Freq: Two times a day (BID) | ORAL | 3 refills | 42.50000 days | Status: AC
Start: 2021-03-02 — End: ?

## 2021-03-20 ENCOUNTER — Encounter: Admit: 2021-03-20 | Discharge: 2021-03-20 | Payer: MEDICARE

## 2021-03-20 ENCOUNTER — Ambulatory Visit: Admit: 2021-03-20 | Discharge: 2021-03-21 | Payer: MEDICARE

## 2021-03-20 DIAGNOSIS — E119 Type 2 diabetes mellitus without complications: Secondary | ICD-10-CM

## 2021-03-20 DIAGNOSIS — G4486 Cervicogenic headache: Secondary | ICD-10-CM

## 2021-03-20 DIAGNOSIS — M47812 Spondylosis without myelopathy or radiculopathy, cervical region: Secondary | ICD-10-CM

## 2021-03-20 DIAGNOSIS — M5481 Occipital neuralgia: Secondary | ICD-10-CM

## 2021-03-20 DIAGNOSIS — I10 Essential (primary) hypertension: Secondary | ICD-10-CM

## 2021-05-16 ENCOUNTER — Encounter: Admit: 2021-05-16 | Discharge: 2021-05-16 | Payer: MEDICARE

## 2021-05-29 ENCOUNTER — Ambulatory Visit: Admit: 2021-05-29 | Discharge: 2021-05-29 | Payer: MEDICARE

## 2021-05-29 ENCOUNTER — Encounter: Admit: 2021-05-29 | Discharge: 2021-05-29 | Payer: MEDICARE

## 2021-05-29 DIAGNOSIS — I10 Essential (primary) hypertension: Secondary | ICD-10-CM

## 2021-06-05 ENCOUNTER — Ambulatory Visit: Admit: 2021-06-05 | Discharge: 2021-06-06 | Payer: MEDICARE

## 2021-06-05 ENCOUNTER — Encounter: Admit: 2021-06-05 | Discharge: 2021-06-05 | Payer: MEDICARE

## 2021-06-05 DIAGNOSIS — G44011 Episodic cluster headache, intractable: Secondary | ICD-10-CM

## 2021-06-05 DIAGNOSIS — N289 Disorder of kidney and ureter, unspecified: Secondary | ICD-10-CM

## 2021-06-05 DIAGNOSIS — R0989 Other specified symptoms and signs involving the circulatory and respiratory systems: Secondary | ICD-10-CM

## 2021-06-05 DIAGNOSIS — Z8249 Family history of ischemic heart disease and other diseases of the circulatory system: Secondary | ICD-10-CM

## 2021-06-05 DIAGNOSIS — Z23 Encounter for immunization: Secondary | ICD-10-CM

## 2021-06-05 DIAGNOSIS — I10 Essential (primary) hypertension: Secondary | ICD-10-CM

## 2021-06-05 DIAGNOSIS — E119 Type 2 diabetes mellitus without complications: Secondary | ICD-10-CM

## 2021-06-05 DIAGNOSIS — T887XXA Unspecified adverse effect of drug or medicament, initial encounter: Secondary | ICD-10-CM

## 2021-06-05 DIAGNOSIS — C50112 Malignant neoplasm of central portion of left female breast: Secondary | ICD-10-CM

## 2021-06-05 DIAGNOSIS — R0602 Shortness of breath: Secondary | ICD-10-CM

## 2021-06-05 NOTE — Progress Notes
Date of Service: 06/05/2021    Andrea Branch is a 88 y.o. female.       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    Patient is here today for a follow-up office visit, she states that her blood pressure sometimes is labile, when it is in the range of 120/80 she does not take any blood pressure medications.    She does continue on clonidine patch in addition to a calcium channel blocker ARB and torsemide.    She does not report having any symptoms of chest pain, no heart palpitations, no presyncope or syncope.    Few days ago, she did have a fall while in her room, it was a mechanical fall due to very likely poor mobility in the setting of osteoarthritis.    A 2D echo Doppler study performed on 05/29/2021-normal LVEF = 65%, normal RV size and function, no valvular abnormalities.         Vitals:    06/05/21 0826   BP: (!) 168/82   BP Source: Arm, Left Upper   Pulse: 86   SpO2: 95%   O2 Device: None (Room air)   PainSc: Two   Weight: 65 kg (143 lb 3.2 oz)   Height: 152.4 cm (5')     Body mass index is 27.97 kg/m?Marland Kitchen     Past Medical History  Patient Active Problem List    Diagnosis Date Noted   ? 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 Intractable episodic cluster headache 03/02/2020   ? Orthostatic headache 12/02/2019   ? Hypotension due to drugs 12/02/2019   ? COVID-19 vaccine administered 07/29/2019   ? Renal insufficiency 05/13/2019   ? 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 Medication side effect 08/18/2018   ? Labile blood pressure 08/04/2018   ? Family history of coronary artery disease 07/16/2018   ? Shortness of breath 07/16/2018   ? Chest pain 07/14/2018   ? Essential hypertension 07/14/2018   ? Type II diabetes mellitus (HCC) 07/14/2018         Review of Systems   Constitutional: Negative.   HENT: Negative.    Eyes: Negative.    Cardiovascular: Negative.    Respiratory: Negative.    Endocrine: Negative.    Hematologic/Lymphatic: Negative.    Skin: Negative.    Musculoskeletal: Negative.    Gastrointestinal: Negative.    Genitourinary: Negative.    Neurological: Negative.    Psychiatric/Behavioral: Negative.    Allergic/Immunologic: Negative.        Physical Exam  General Appearance: normal in appearance  Skin: warm, moist, no ulcers or xanthomas  Eyes: conjunctivae and lids normal, pupils are equal and round  Lips & Oral Mucosa: no pallor or cyanosis  Neck Veins: neck veins are flat, neck veins are not distended  Chest Inspection: chest is normal in appearance  Respiratory Effort: breathing comfortably, no respiratory distress  Auscultation/Percussion: lungs clear to auscultation, no rales or rhonchi, no wheezing  Cardiac Rhythm: regular rhythm and normal rate  Cardiac Auscultation: S1, S2 normal, no rub, no gallop  Murmurs: no murmur  Carotid Arteries: normal carotid upstroke bilaterally, no bruit  Lower  Extremity Edema: no lower extremity edema  Abdominal Exam: soft, non-tender, no masses, bowel sounds normal  Liver & Spleen: no organomegaly  Language and Memory: patient responsive and seems to comprehend information  Neurologic Exam: neurological assessment grossly intact      Cardiovascular Studies      Cardiovascular Health Factors  Vitals BP Readings from Last 3 Encounters:   06/05/21 (!) 168/82   05/29/21 120/55   01/25/21 (!) 175/84     Wt Readings from Last 3 Encounters:   06/05/21 65 kg (143 lb 3.2 oz)   05/29/21 64.9 kg (143 lb)   01/25/21 70.3 kg (155 lb)     BMI Readings from Last 3 Encounters:   06/05/21 27.97 kg/m?   05/29/21 27.93 kg/m?   01/25/21 30.27 kg/m?      Smoking Social History     Tobacco Use   Smoking Status Never   Smokeless Tobacco Never      Lipid Profile Cholesterol   Date Value Ref Range Status   07/20/2018 159  Final   07/20/2018 159  Final     HDL   Date Value Ref Range Status   07/20/2018 47  Final   07/20/2018 47  Final     LDL   Date Value Ref Range Status   07/20/2018 90  Final   07/20/2018 90  Final     Triglycerides   Date Value Ref Range Status   07/20/2018 109  Final   07/20/2018 109  Final      Blood Sugar Hemoglobin A1C   Date Value Ref Range Status   11/01/2020 6.3  Final     Glucose   Date Value Ref Range Status   04/30/2021 64 (L) 70 - 105 Final   04/23/2021 105  Final   11/01/2020 156 (H) 70 - 105 Final          Problems Addressed Today  Encounter Diagnoses   Name Primary?   ? Essential hypertension Yes   ? COVID-19 vaccine administered    ? Family history of coronary artery disease    ? Intractable episodic cluster headache    ? Labile blood pressure    ? Malignant neoplasm of central portion of left female breast, unspecified estrogen receptor status (HCC)    ? Medication side effect    ? Renal insufficiency    ? Shortness of breath    ? Type 2 diabetes mellitus without complication, without long-term current use of insulin (HCC)        Assessment and Plan     Assessment:    1.  Primary hypertension  Today suboptimally controlled, however patient states that this morning while at the [facility] her blood pressure was measured, it was 120/80 and she did not take any blood pressure medications  2.  Labile hypertension-1 contributing factor could be peripheral neuropathy due to diabetes mellitus type 2  3.  History of left breast cancer-currently on remission, patient continues on anastrozole  4.  Diabetes mellitus type 2-patient states that her hemoglobin A1c is 6.2%, she is on metformin and sitagliptin  5.  Osteoarthritis with main involvement of the cervical spine-patient did undergo steroid injections, currently she has been experiencing recurrent left-sided pain  6.  CKD    Plan:    1. In the light of normal hemoglobin A1c, and blood glucose of 64 mg/dL on 93/23/5573 I suggest review her diabetes medication and perhaps discontinue one of them (?  Should that be metformin),  2.  Continue all current cardiac medications  3.  I do recommend to continue physical therapy  4.  Follow-up office visit in 6 months    Total Time Today was 30 minutes in the following activities: Preparing to see the patient, Obtaining and/or reviewing separately obtained history, Performing a medically appropriate examination and/or evaluation, Counseling and educating the patient/family/caregiver, Ordering medications, tests, or procedures, Referring and communication with other health care professionals (when not separately reported), Documenting clinical information in the electronic or other health record, Independently interpreting results (not separately reported) and communicating results to the patient/family/caregiver and Care coordination (not separately reported)         Current Medications (including today's revisions)  ? acetaminophen (TYLENOL) 500 mg tablet Take two tablets by mouth twice daily. Max of 4,000 mg of acetaminophen in 24 hours.   ? allopurinoL (ZYLOPRIM) 300 mg tablet    ? anastrozole (ARIMIDEX) 1 mg tablet Take one tablet by mouth daily.   ? carboxymethylcellulose sodium (REFRESH TEARS) 0.5 % eye drop Apply one drop to both eyes every 6 hours as needed.   ? cloNIDine (CATAPRES-TTS-1) 0.1 mg/day patch Apply one patch to top of skin as directed every 7 days. Apply one patch along with one 0.3 mg clonidine patch for a total of 0.4 mg.   ? cloNIDine (CATAPRES-TTS-3) 0.3 mg/day patch Apply one patch to top of skin as directed every 7 days.   ? cloNIDine HCL (CATAPRES) 0.1 mg tablet Take one tablet by mouth daily as needed.   ? colchicine (COLCRYS) 0.6 mg tablet Take one tablet by mouth daily.   ? Dextromethorphan-Guaifenesin (CORICIDIN HBP CHEST CONG-COUGH) 10-200 mg cap Take 1 capsule by mouth every 6 hours as needed.   ? diclofenac (VOLTAREN) 1 % topical gel Apply two g topically to affected area twice daily.   ? gabapentin (NEURONTIN) 300 mg capsule Take 900 mg at bedtime daily   ? gabapentin (NEURONTIN) 300 mg capsule Take two capsules by mouth at bedtime daily.   ? hydrALAZINE (APRESOLINE) 50 mg tablet Take one tablet by mouth twice daily.   ? HYDROcodone/acetaminophen (NORCO) 5/325 mg tablet Take one tablet by mouth every 8 hours as needed for Pain.   ? hydrocortisone 1 % topical cream Apply  topically to affected area as Needed (Apply to ears).   ? Lactoperoxi/Gluc Oxid/Pot Thio (BIOTENE DRY MOUTH MM) by Mucous Membrane route five times daily as needed.   ? lidocaine (TOPICAINE) 4 % gel Apply 1 g topically to affected area three times daily as needed.   ? metFORMIN (GLUCOPHAGE) 500 mg tablet Take one tablet by mouth daily with breakfast.   ? NIFEdipine SR (PROCARDIA-XL) 60 mg tablet Take one tablet by mouth daily.   ? olmesartan (BENICAR) 40 mg tablet Take one tablet by mouth daily. Indications: high blood pressure   ? PEG 400-Propylene Glycol (SYSTANE (PROPYLENE GLYCOL)) 0.4-0.3 % drop Place 1 drop into or around eye(s) twice daily.   ? potassium chloride (K-DUR) 10 mEq tablet Take one tablet by mouth daily. Take with a meal and a full glass of water.   ? SITagliptin (JANUVIA) 50 mg tab Take one tablet by mouth daily.   ? tiZANidine (ZANAFLEX) 4 mg tablet Take one tablet by mouth twice daily.   ? torsemide (DEMADEX) 20 mg tablet Take one tablet by mouth daily.   ? traMADoL (ULTRAM) 50 mg tablet Take one tablet by mouth every 6 hours.   ? VENTOLIN HFA 90 mcg/actuation aerosol inhaler  Inhale two puffs by mouth into the lungs every 8 hours as needed for Wheezing or Shortness of Breath. Shake well before use.   ? vit A/vit C/vit E/zinc/copper (PRESERVISION AREDS PO) Take 1 capsule by mouth twice daily.

## 2021-06-20 ENCOUNTER — Encounter: Admit: 2021-06-20 | Discharge: 2021-06-20 | Payer: MEDICARE

## 2021-06-20 ENCOUNTER — Ambulatory Visit: Admit: 2021-06-20 | Discharge: 2021-06-20 | Payer: MEDICARE

## 2021-06-20 DIAGNOSIS — M7918 Myalgia, other site: Secondary | ICD-10-CM

## 2021-06-20 DIAGNOSIS — M5481 Occipital neuralgia: Secondary | ICD-10-CM

## 2021-06-20 DIAGNOSIS — M47812 Spondylosis without myelopathy or radiculopathy, cervical region: Secondary | ICD-10-CM

## 2021-06-20 MED ORDER — TRIAMCINOLONE ACETONIDE 40 MG/ML IJ SUSP
40 mg | Freq: Once | INTRAMUSCULAR | 0 refills | Status: CP | PRN
Start: 2021-06-20 — End: ?
  Administered 2021-06-20: 19:00:00 40 mg via INTRAMUSCULAR

## 2021-06-20 MED ORDER — BUPIVACAINE (PF) 0.25 % (2.5 MG/ML) IJ SOLN
4 mL | Freq: Once | INTRAMUSCULAR | 0 refills | Status: CP | PRN
Start: 2021-06-20 — End: ?
  Administered 2021-06-20: 19:00:00 4 mL via INTRAMUSCULAR

## 2021-06-20 MED ORDER — BUPIVACAINE (PF) 0.25 % (2.5 MG/ML) IJ SOLN
3 mL | Freq: Once | INTRAMUSCULAR | 0 refills | Status: CP | PRN
Start: 2021-06-20 — End: ?
  Administered 2021-06-20: 19:00:00 3 mL via INTRAMUSCULAR

## 2021-06-20 NOTE — Procedures
Attending Surgeon: Blanchard Kelch Kimbrely Buckel, APRN-NP    Anesthesia: Local    Pre-Procedure Diagnosis:   1. Myalgia, other site    2. Cervico-occipital neuralgia of left side        Post-Procedure Diagnosis:   1. Myalgia, other site    2. Cervico-occipital neuralgia of left side        Pain Score: Four    Nerve Block  Nerve: occipital  Laterality: left   on 06/20/2021 2:20 PM    Consent:   Consent obtained: written  Consent given by: patient  Alternatives discussed: alternative treatment  Discussed with patient the purpose of the treatment/procedure, other ways of treating my condition, including no treatment/ procedure and the risks and benefits of the alternatives. Patient has decided to proceed with treatment/procedure.        Universal Protocol:  Relevant documents: relevant documents present and verified  Site marked: the operative site was marked  Patient identity confirmed: Patient identify confirmed verbally with patient.        Time out: Immediately prior to procedure a "time out" was called to verify the correct patient, procedure, equipment, support staff and site/side marked as required        Procedures Details:   Indications: Pain Relief (occipital neuralgia)  Prep: alcohol  Patient position: sitting  Needle size: 27 G  Guidance: anatomical  Medications administered: 40 mg triamcinolone acetonide 40 mg/mL; 3 mL bupivacaine PF 0.25 %  Outcome: Pain improved  Patient tolerance: tolerated well, no immediate complications          Estimated blood loss: none or minimal  Specimens: none  Patient tolerated the procedure well with no immediate complications. Pressure was applied, and hemostasis was accomplished.

## 2021-06-20 NOTE — Progress Notes
[facility] CLINIC NOTE       SUBJECTIVE:   Chronic neck pain and HA on left side   Last seen in Jan   Previously had TPI and ONCB with >50%   Discussed MBB at last visit   She had been doing well until recently   Pain more intense and more constant   Affects left ear and behind the eye on left side     Comes in to discuss trx options        Review of Systems    Current Outpatient Medications:   ?  acetaminophen (TYLENOL) 500 mg tablet, Take two tablets by mouth twice daily. Max of 4,000 mg of acetaminophen in 24 hours., Disp: , Rfl:   ?  allopurinoL (ZYLOPRIM) 300 mg tablet, , Disp: , Rfl:   ?  anastrozole (ARIMIDEX) 1 mg tablet, Take one tablet by mouth daily., Disp: , Rfl:   ?  carboxymethylcellulose sodium (REFRESH TEARS) 0.5 % eye drop, Apply one drop to both eyes every 6 hours as needed., Disp: , Rfl:   ?  cloNIDine (CATAPRES-TTS-1) 0.1 mg/day patch, Apply one patch to top of skin as directed every 7 days. Apply one patch along with one 0.3 mg clonidine patch for a total of 0.4 mg., Disp: 4 patch, Rfl: 3  ?  cloNIDine (CATAPRES-TTS-3) 0.3 mg/day patch, Apply one patch to top of skin as directed every 7 days., Disp: 12 patch, Rfl: 3  ?  cloNIDine HCL (CATAPRES) 0.1 mg tablet, Take one tablet by mouth daily as needed., Disp: , Rfl:   ?  colchicine (COLCRYS) 0.6 mg tablet, Take one tablet by mouth daily., Disp: , Rfl:   ?  Dextromethorphan-Guaifenesin (CORICIDIN HBP CHEST CONG-COUGH) 10-200 mg cap, Take 1 capsule by mouth every 6 hours as needed., Disp: , Rfl:   ?  diclofenac (VOLTAREN) 1 % topical gel, Apply two g topically to affected area twice daily., Disp: , Rfl:   ?  gabapentin (NEURONTIN) 300 mg capsule, Take 900 mg at bedtime daily, Disp: 270 capsule, Rfl: 3  ?  hydrALAZINE (APRESOLINE) 50 mg tablet, Take one tablet by mouth twice daily., Disp: 180 tablet, Rfl: 3  ?  hydrocortisone 1 % topical cream, Apply  topically to affected area as Needed (Apply to ears)., Disp: , Rfl:   ?  Lactoperoxi/Gluc Oxid/Pot Thio (BIOTENE DRY MOUTH MM), by Mucous Membrane route five times daily as needed., Disp: , Rfl:   ?  lidocaine (TOPICAINE) 4 % gel, Apply 1 g topically to affected area three times daily as needed., Disp: 30 g, Rfl: 1  ?  metFORMIN (GLUCOPHAGE) 500 mg tablet, Take one tablet by mouth daily with breakfast., Disp: , Rfl:   ?  NIFEdipine SR (PROCARDIA-XL) 60 mg tablet, Take one tablet by mouth daily., Disp: , Rfl:   ?  olmesartan (BENICAR) 40 mg tablet, Take one tablet by mouth daily. Indications: high blood pressure, Disp: 90 tablet, Rfl: 3  ?  PEG 400-Propylene Glycol (SYSTANE (PROPYLENE GLYCOL)) 0.4-0.3 % drop, Place 1 drop into or around eye(s) twice daily., Disp: , Rfl:   ?  potassium chloride (K-DUR) 10 mEq tablet, Take one tablet by mouth daily. Take with a meal and a full glass of water., Disp: , Rfl:   ?  SITagliptin (JANUVIA) 50 mg tab, Take one tablet by mouth daily., Disp: , Rfl:   ?  tiZANidine (ZANAFLEX) 4 mg tablet, Take one tablet by mouth twice daily., Disp: , Rfl:   ?  torsemide (DEMADEX) 20 mg tablet, Take one tablet by mouth daily., Disp: , Rfl:   ?  traMADoL (ULTRAM) 50 mg tablet, Take one tablet by mouth every 6 hours., Disp: , Rfl:   ?  vit A/vit C/vit E/zinc/copper (PRESERVISION AREDS PO), Take 1 capsule by mouth twice daily., Disp: , Rfl:   Allergies   Allergen Reactions   ? Shellfish Containing Products UNKNOWN     Physical Exam  Vitals:    06/20/21 1439   BP: (!) 148/74   BP Source: Arm, Left Upper   Pulse: 64   SpO2: 98%   PainSc: Four   Weight: 65.8 kg (145 lb)   Height: 152.4 cm (5')     Oswestry Total Score:: 20  Pain Score: Four  Body mass index is 28.32 kg/m?Marland Kitchen    General: Alert, oriented, mild distress  HEENT: normocephalic  NECK: TTP along c spine and paracerv muscles. Limited ROM  Resp: Non labored breathing, no distress  Cardio: Pedal pulses palpable bilaterally, mild lower extremity edema  MS: TTP in lumbar region of spine and paraspinal muscles.  NEURO: Cranial nerves II-XII intact. Motor strength 5+ throughout, sensory exams normal. DTR's elicited 2+ throughout. Gait coordinated.   Behavior: Calm, cooperative, behavior and speech normal.         IMPRESSION:  1. Myalgia, other site    2. Cervico-occipital neuralgia of left side    3. Multilevel cervical spondylosis without myelopathy        PLAN:    Repeat TPI and OCNB today   Consider MBB if the above treatment is not lasting   Medial Branch Block Checklist:    Moderate to severe chronic neck pain, predominately axial, that causes functional deficit measured on pain or disability scale.  Yes *[x]   No []   Pain present for minimum of 3 months with documented failure to respond to noninvasive conservative management such as meds, injections, therapy     Patient has radiculopathy No  Previous radiculopathy treated by ESI injection Yes *[]   No []     NA [x]      Patient has neurogenic claudication   Yes *[]   No [x]   Has neurogenic claudication been treated   Yes *[]   No []     NA [x]              There is no non-facet pathology per clinical assessment or radiology studies that could explain the source of the patient 's pain, including but not limited to           Fracture [x]   Tumor [x]  Infection [x]   Significant deformity [x]     Has this level been previously ablated greater than 2 years Yes *[]   No [x]     NA []      This level has never had an Anterior Lumbar Interbody Fusion      Yes *[]   No [x]          Oswestry:  Oswestry Total Score:: 20

## 2021-06-20 NOTE — Procedures
Attending Surgeon: Blanchard Kelch Haeven Nickle, APRN-NP    Anesthesia: Local    Pre-Procedure Diagnosis:   1. Myalgia, other site    2. Cervico-occipital neuralgia of left side        Post-Procedure Diagnosis:   1. Myalgia, other site    2. Cervico-occipital neuralgia of left side        Pain Score: Four    Trigger Point  Locations: L upper trapezius and L cervical/thoracic/lumbar paraspinal  Consent:   Consent obtained: written  Consent given by: patient  Alternatives discussed: alternative treatment     Universal Protocol:  Relevant documents: relevant documents present and verified  Patient identity confirmed: Patient identify confirmed verbally with patient.          Procedures Details:   Indications: cervicalgia, pain and myalgiaPrep: alcohol  Needle size: 27 G  Number of muscles: 1 or 2  Approach: posterior  Medications administered: 4 mL bupivacaine PF 0.25 %  Patient tolerance: Patient tolerated the procedure well with no immediate complications. Pressure was applied, and hemostasis was accomplished.          Estimated blood loss: none or minimal  Specimens: none  Patient tolerated the procedure well with no immediate complications. Pressure was applied, and hemostasis was accomplished.

## 2021-08-08 ENCOUNTER — Encounter: Admit: 2021-08-08 | Discharge: 2021-08-08 | Payer: MEDICARE

## 2021-08-08 NOTE — Telephone Encounter
RN discussed with Nila Nephew, RN-pt's RN at SNF today. All questions are answered.

## 2021-08-09 ENCOUNTER — Ambulatory Visit: Admit: 2021-08-09 | Discharge: 2021-08-09 | Payer: MEDICARE

## 2021-08-09 ENCOUNTER — Encounter: Admit: 2021-08-09 | Discharge: 2021-08-09 | Payer: MEDICARE

## 2021-08-09 DIAGNOSIS — M47812 Spondylosis without myelopathy or radiculopathy, cervical region: Secondary | ICD-10-CM

## 2021-08-09 DIAGNOSIS — M5481 Occipital neuralgia: Secondary | ICD-10-CM

## 2021-08-09 DIAGNOSIS — I10 Essential (primary) hypertension: Secondary | ICD-10-CM

## 2021-08-09 DIAGNOSIS — E119 Type 2 diabetes mellitus without complications: Secondary | ICD-10-CM

## 2021-08-09 MED ORDER — BUPIVACAINE (PF) 0.5 % (5 MG/ML) IJ SOLN
2 mL | Freq: Once | INTRAMUSCULAR | 0 refills | Status: CP
Start: 2021-08-09 — End: ?
  Administered 2021-08-09: 15:00:00 2 mL via INTRAMUSCULAR

## 2021-08-09 NOTE — Progress Notes
SPINE [facility]ENTER  INTERVENTIONAL PAIN PROCEDURE HISTORY AND PHYSICAL    Chief Complaint: Pain    HISTORY OF PRESENT ILLNESS:  pain    Medical History:   Diagnosis Date    Essential hypertension 07/14/2018    Type II diabetes mellitus ([place]) 07/14/2018       Surgical History:   Procedure Laterality Date    BREAST SURGERY Left     Breast cancer    HX APPENDECTOMY      HX KNEE REPLACMENT Bilateral        family history includes Heart Attack in her brother, brother, and father.    Social History     Socioeconomic History    Marital status: Single   Tobacco Use    Smoking status: Never    Smokeless tobacco: Never   Substance and Sexual Activity    Alcohol use: Not Currently    Drug use: Never       Allergies   Allergen Reactions    Shellfish Containing Products UNKNOWN       Vitals:    08/09/21 0858   BP: (!) 159/74   BP Source: Arm, Left Upper   Pulse: 68   Temp: 36.6 C (97.8 F)   Resp: 12   SpO2: 98%   TempSrc: Oral   PainSc: Three   Weight: 65.8 kg (145 lb)   Height: 152.4 cm (5')     Pain Score: Three       REVIEW OF SYSTEMS: 10 point ROS obtained and negative except pain      PHYSICAL EXAM:unchanged        IMPRESSION:    1. Cervico-occipital neuralgia of left side    2. 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

## 2021-08-09 NOTE — Procedures
Attending Surgeon: Rushie Nyhan, MD    Anesthesia: Local    Pre-Procedure Diagnosis:   1. Cervico-occipital neuralgia of left side    2. 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 Cervico-occipital neuralgia of left side    2. 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 Patient has decided to proceed with treatment/procedure.   Procedures Details:   Indications: Axial Pain, diagnostic evaluation and pain   Prep: chlorhexidine  Patient position: prone  Specimens: none  Number of Levels: 3  Guidance: fluoroscopy  Needle and Epidural Catheter: quincke  Needle size: 25 G  Injection procedure: Incremental injection, Negative aspiration for blood and Introduced needle  Patient tolerance: Patient tolerated the procedure well with no immediate complications. Pressure was applied, and hemostasis was accomplished.  Outcome: Pain relieved and Pain improved  Comments: .30m bupi inj at each level            Estimated blood loss: none or minimal  Specimens: none  Patient tolerated the procedure well with no immediate complications. Pressure was applied, and hemostasis was accomplished.

## 2021-08-09 NOTE — Progress Notes

## 2021-08-09 NOTE — Patient Instructions
Discharge Instructions for Medial Branch Block    Important information following your procedure today: You may drive today    This injection is for diagnostic purposes, it is a test. Only short-term results are expected.    Though the procedure is generally safe and complications are rare, we do ask that you be aware of any of the following:   Any swelling, persistent redness, new bleeding, or drainage from the site of the injection.  You should not experience a severe headache.  You should not run a fever over 101 F.  New onset of sharp, severe back & or neck pain.  New onset of upper or lower extremity numbness or weakness.  New difficulty controlling bowel or bladder function after the injection.  New shortness of breath.    If any of these occur, please call to report this occurrence to Dr. Khan at [phone]. If you are calling after 4:00 p.m. or on weekends or holidays please call [phone] and ask to have the resident physician on call for the physician paged or go to your local emergency room.    Avoid application of direct heat, hot showers or hot tubs today.    Remain active today. Do the activities that would normally cause you pain. After the injection, you should get at least 80% relief for up to 2-4 hours.    Call back to report the results to a nurse tomorrow at Dr. Khan at [phone]. You may have to leave a message. Give your name and contact information and answer the following questions.  Did you get relief?  Percentage of relief?  How long did it last?    If you are a candidate, a scheduler will call you within the next week to schedule your next procedure. The nurse will call you back if your results were not as expected to discuss next steps in your treatment plan.      The following medications were used: Bupivicaine        If you are unable to keep your upcoming appointment, please notify the [facility] scheduler at [phone] at least 24 hours in advance.

## 2021-08-10 ENCOUNTER — Encounter: Admit: 2021-08-10 | Discharge: 2021-08-10 | Payer: MEDICARE

## 2021-08-10 NOTE — Telephone Encounter
 AMB SPINE INJECT MBB CERV/THOR  Procedure: medial branch block   Laterality: left    Location: - C2, C3 and C4  No % of relief from procedure noted by patient.  [phone] at facility

## 2021-10-02 ENCOUNTER — Encounter: Admit: 2021-10-02 | Discharge: 2021-10-02 | Payer: MEDICARE

## 2021-10-02 NOTE — Telephone Encounter
Manuela Schwartz from the [facility] center called to inquire why the patient is scheduled for an echo in the [place] office on 08/30 when she previously had one completed in April. After looking at the patient's chart I found it was scheduled from an old order that had been completed by an outside facility and is not needed at this time. Discussed with Manuela Schwartz and had appointment cancelled.

## 2021-10-17 ENCOUNTER — Encounter: Admit: 2021-10-17 | Discharge: 2021-10-17 | Payer: MEDICARE

## 2021-11-16 ENCOUNTER — Encounter: Admit: 2021-11-16 | Discharge: 2021-11-16 | Payer: MEDICARE

## 2021-11-20 ENCOUNTER — Encounter: Admit: 2021-11-20 | Discharge: 2021-11-20 | Payer: MEDICARE

## 2021-11-20 NOTE — Telephone Encounter
-----   Message from Vertell Novak, MD sent at 11/19/2021  5:03 PM CDT -----  Please check with [facility] and find out if this patient is able to come to the appointment.  I am call if she is not doing too well and it might be difficult for her to make it to the office even with help.  Perhaps we can do a telehealth visit.    TY

## 2021-11-20 NOTE — Telephone Encounter
Called and spoke with Webb Silversmith, RN at [facility].  She states that Andrea Branch is well enough to come to the appointment and that she will be there in person in [place].  Will callback with any questions, concerns or problems.

## 2021-11-22 ENCOUNTER — Encounter: Admit: 2021-11-22 | Discharge: 2021-11-22 | Payer: MEDICARE

## 2021-11-22 ENCOUNTER — Ambulatory Visit: Admit: 2021-11-22 | Discharge: 2021-11-23 | Payer: MEDICARE

## 2021-11-22 DIAGNOSIS — E119 Type 2 diabetes mellitus without complications: Secondary | ICD-10-CM

## 2021-11-22 DIAGNOSIS — Z8249 Family history of ischemic heart disease and other diseases of the circulatory system: Secondary | ICD-10-CM

## 2021-11-22 DIAGNOSIS — R634 Abnormal weight loss: Secondary | ICD-10-CM

## 2021-11-22 DIAGNOSIS — Z23 Encounter for immunization: Secondary | ICD-10-CM

## 2021-11-22 DIAGNOSIS — C50112 Malignant neoplasm of central portion of left female breast: Secondary | ICD-10-CM

## 2021-11-22 DIAGNOSIS — G44011 Episodic cluster headache, intractable: Secondary | ICD-10-CM

## 2021-11-22 DIAGNOSIS — I10 Essential (primary) hypertension: Secondary | ICD-10-CM

## 2021-11-22 DIAGNOSIS — I952 Hypotension due to drugs: Secondary | ICD-10-CM

## 2021-11-22 DIAGNOSIS — R0602 Shortness of breath: Secondary | ICD-10-CM

## 2021-11-22 NOTE — Progress Notes
Date of Service: 11/22/2021    Andrea Branch is a 88 y.o. female.       HPI        Andrea Branch is a 88 y.o. 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    Andrea Branch does not have any current cardiovascular related symptoms.    She has become progressively weaker, she has had few falls in her room.  She is currently in a wheelchair and also is not rolled in physical therapy program at the [facility].    She does not report chest pain, no heart palpitations.  She does report head pressure that could be secondary to cervical spine osteoarthritis.    The last evaluation with an echocardiogram in April 2023-normal LVEF = 65%, mild LVH, there are no significant cardiac valve structures abnormalities.       Vitals:    11/22/21 0822   BP: 138/74   BP Source: Arm, Left Upper   Pulse: 82   SpO2: 96%   O2 Device: None (Room air)   Weight: 60.9 kg (134 lb 3.2 oz)   Height: 152.4 cm (5')     Body mass index is 26.21 kg/m?Marland Kitchen     Past Medical History  Patient Active Problem List    Diagnosis Date Noted   ? 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 Intractable episodic cluster headache 03/02/2020   ? Orthostatic headache 12/02/2019   ? Hypotension due to drugs 12/02/2019   ? COVID-19 vaccine administered 07/29/2019   ? Renal insufficiency 05/13/2019   ? 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 Medication side effect 08/18/2018   ? Labile blood pressure 08/04/2018   ? Family history of coronary artery disease 07/16/2018   ? Shortness of breath 07/16/2018   ? Chest pain 07/14/2018   ? Essential hypertension 07/14/2018   ? Type II diabetes mellitus (HCC) 07/14/2018         Review of Systems   Constitutional: Negative.   HENT: Negative.    Eyes: Negative.    Cardiovascular: Negative.    Respiratory: Negative.    Endocrine: Negative.    Hematologic/Lymphatic: Negative.    Skin: Negative.    Musculoskeletal: Negative.    Gastrointestinal: Negative.    Genitourinary: Negative.    Neurological: Negative.    Psychiatric/Behavioral: Negative.    Allergic/Immunologic: Negative.        Physical Exam  General Appearance: normal in appearance, currently in a wheelchair, BMI 26.21 kg/m?  Skin: warm, moist, no ulcers or xanthomas  Eyes: conjunctivae and lids normal, pupils are equal and round  Lips & Oral Mucosa: no pallor or cyanosis  Neck Veins: neck veins are flat, neck veins are not distended  Chest Inspection: chest is normal in appearance  Respiratory Effort: breathing comfortably, no respiratory distress  Auscultation/Percussion: lungs clear to auscultation, no rales or rhonchi, no wheezing  Cardiac Rhythm: regular rhythm and normal rate  Cardiac Auscultation: S1, S2 normal, no rub, no gallop  Murmurs: no murmur  Carotid Arteries: normal carotid upstroke bilaterally, no bruit  Lower Extremity Edema: no lower extremity edema  Abdominal Exam: soft, non-tender, no masses, bowel sounds normal  Liver & Spleen: no organomegaly  Language and Memory: patient responsive and seems to comprehend information  Neurologic Exam: neurological assessment grossly intact      Cardiovascular Studies      Cardiovascular Health Factors  Vitals BP Readings from Last 3 Encounters:   11/22/21 138/74   08/09/21 (!) 155/98   06/20/21 (!) 148/74     Wt Readings from Last 3 Encounters:   11/22/21 60.9 kg (134 lb 3.2 oz)   08/09/21 65.8 kg (145 lb)   06/20/21 65.8 kg (145 lb)     BMI Readings from Last 3 Encounters:   11/22/21 26.21 kg/m?   08/09/21 28.32 kg/m?   06/20/21 28.32 kg/m? Smoking Social History     Tobacco Use   Smoking Status Never   Smokeless Tobacco Never      Lipid Profile Cholesterol   Date Value Ref Range Status   07/20/2018 159  Final   07/20/2018 159  Final     HDL   Date Value Ref Range Status   07/20/2018 47  Final   07/20/2018 47  Final     LDL   Date Value Ref Range Status   07/20/2018 90  Final   07/20/2018 90  Final     Triglycerides   Date Value Ref Range Status   07/20/2018 109  Final   07/20/2018 109  Final      Blood Sugar Hemoglobin A1C   Date Value Ref Range Status   11/01/2020 6.3  Final     Glucose   Date Value Ref Range Status   10/31/2021 109 (H) 70 - 105 Final   10/24/2021 75  Final   04/30/2021 64 (L) 70 - 105 Final          Problems Addressed Today  Encounter Diagnoses   Name Primary?   ? Essential hypertension Yes   ? Hypotension due to drugs    ? Type 2 diabetes mellitus without complication, without long-term current use of insulin (HCC)    ? Family history of coronary artery disease    ? Shortness of breath    ? Malignant neoplasm of central portion of left female breast, unspecified estrogen receptor status (HCC)    ? COVID-19 vaccine administered    ? Intractable episodic cluster headache    ? Weight loss, unintentional        Assessment and Plan     Assessment:    1.  Primary hypertension  ? Well-controlled  ? Patient continues on clonidine patch and olmesartan  2.  History of HFrEF  ? Patient used to have lower extremity edema  ? She is currently on torsemide  ? The physical exam did not demonstrate any lower extremity edema  3.  History of labile hypertension-currently not an issue  4.  Unintentional weight loss  5.  Progressive generalized weakness and recent history of falls- improving  6.  Diabetes mellitus type 2-patient is not on metformin anymore, possibly the hemoglobin A1c has normalized with the weight loss  7.  Headaches and head pressure-likely due to cervical spine osteoarthritis  8.  History of l-the most recent creatinine 1.12 mg/dL on 1/61/0960 9.  History of left breast cancer-patient continues on anastrozole  10.  CKD, elevated creatinine = 1.12 mg/dL on 4/54/0981    Plan:    1.  Discontinue torsemide  2.  Start hydrochlorothiazide 25 mg  Initially could be p.o. daily  If  patient will not have lower extremity edema then changed the HCTZ 25 mg every other day or perhaps even consider 12.5 mg every other day  3.  Physical therapy for overall muscle strengthening and her state of generalized weakness  4.  Tylenol arthritis 500 mg p.o. 3 times daily for osteoarthritic pain  5.  Can consider again steroid shots in the cervical spine  6.  Follow-up office visit in April - May 2024.    Total Time Today was 40 minutes in the following activities: Preparing to see the patient, Obtaining and/or reviewing separately obtained history, Performing a medically appropriate examination and/or evaluation, Counseling and educating the patient/family/caregiver, Ordering medications, tests, or procedures, Referring and communication with other health care professionals (when not separately reported), Documenting clinical information in the electronic or other health record, Independently interpreting results (not separately reported) and communicating results to the patient/family/caregiver and Care coordination (not separately reported)                   Current Medications (including today's revisions)  ? acetaminophen (TYLENOL) 500 mg tablet Take two tablets by mouth twice daily. Max of 4,000 mg of acetaminophen in 24 hours.   ? allopurinoL (ZYLOPRIM) 300 mg tablet    ? anastrozole (ARIMIDEX) 1 mg tablet Take one tablet by mouth daily.   ? carboxymethylcellulose sodium (REFRESH TEARS) 0.5 % eye drop Apply one drop to both eyes every 6 hours as needed.   ? cloNIDine (CATAPRES-TTS-1) 0.1 mg/day patch Apply one patch to top of skin as directed every 7 days. Apply one patch along with one 0.3 mg clonidine patch for a total of 0.4 mg.   ? cloNIDine (CATAPRES-TTS-3) 0.3 mg/day patch Apply one patch to top of skin as directed every 7 days.   ? cloNIDine HCL (CATAPRES) 0.1 mg tablet Take one tablet by mouth daily as needed.   ? colchicine (COLCRYS) 0.6 mg tablet Take one tablet by mouth daily.   ? Dextromethorphan-Guaifenesin (CORICIDIN HBP CHEST CONG-COUGH) 10-200 mg cap Take 1 capsule by mouth every 6 hours as needed.   ? diclofenac (VOLTAREN) 1 % topical gel Apply two g topically to affected area twice daily.   ? gabapentin (NEURONTIN) 300 mg capsule Take 900 mg at bedtime daily   ? hydrALAZINE (APRESOLINE) 50 mg tablet Take one tablet by mouth twice daily.   ? hydrocortisone 1 % topical cream Apply  topically to affected area as Needed (Apply to ears).   ? Lactoperoxi/Gluc Oxid/Pot Thio (BIOTENE DRY MOUTH MM) by Mucous Membrane route five times daily as needed.   ? lidocaine (TOPICAINE) 4 % gel Apply 1 g topically to affected area three times daily as needed.   ? meclizine (ANTIVERT) 25 mg tablet Take one tablet by mouth three times daily as needed.   ? metFORMIN (GLUCOPHAGE) 500 mg tablet Take one tablet by mouth daily with breakfast.   ? NIFEdipine SR (PROCARDIA-XL) 60 mg tablet Take one tablet by mouth daily.   ? olmesartan (BENICAR) 40 mg tablet Take one tablet by mouth daily. Indications: high blood pressure (Patient taking differently: Take 10 mg by mouth daily. Indications: high blood pressure)   ? PEG 400-Propylene Glycol (SYSTANE (PROPYLENE GLYCOL)) 0.4-0.3 % drop Place 1 drop into or around eye(s) twice daily.   ? potassium chloride (K-DUR) 10 mEq tablet Take one tablet by mouth daily. Take with a meal and a full glass of water.   ? SITagliptin (JANUVIA) 50 mg tab Take one tablet by  mouth daily.   ? tiZANidine (ZANAFLEX) 4 mg tablet Take one tablet by mouth twice daily.   ? torsemide (DEMADEX) 20 mg tablet Take one tablet by mouth daily.   ? traMADoL (ULTRAM) 50 mg tablet Take one tablet by mouth every 6 hours.   ? vit A/vit C/vit E/zinc/copper (PRESERVISION AREDS PO) Take 1 capsule by mouth twice daily.

## 2022-05-21 ENCOUNTER — Encounter: Admit: 2022-05-21 | Discharge: 2022-05-21 | Payer: MEDICARE

## 2022-05-30 ENCOUNTER — Encounter: Admit: 2022-05-30 | Discharge: 2022-05-30 | Payer: MEDICARE

## 2022-12-15 IMAGING — CR ANKCMLT
1 series · 1 of 1 positions shown · non-contrast
Comparison: none

[ankle lat]
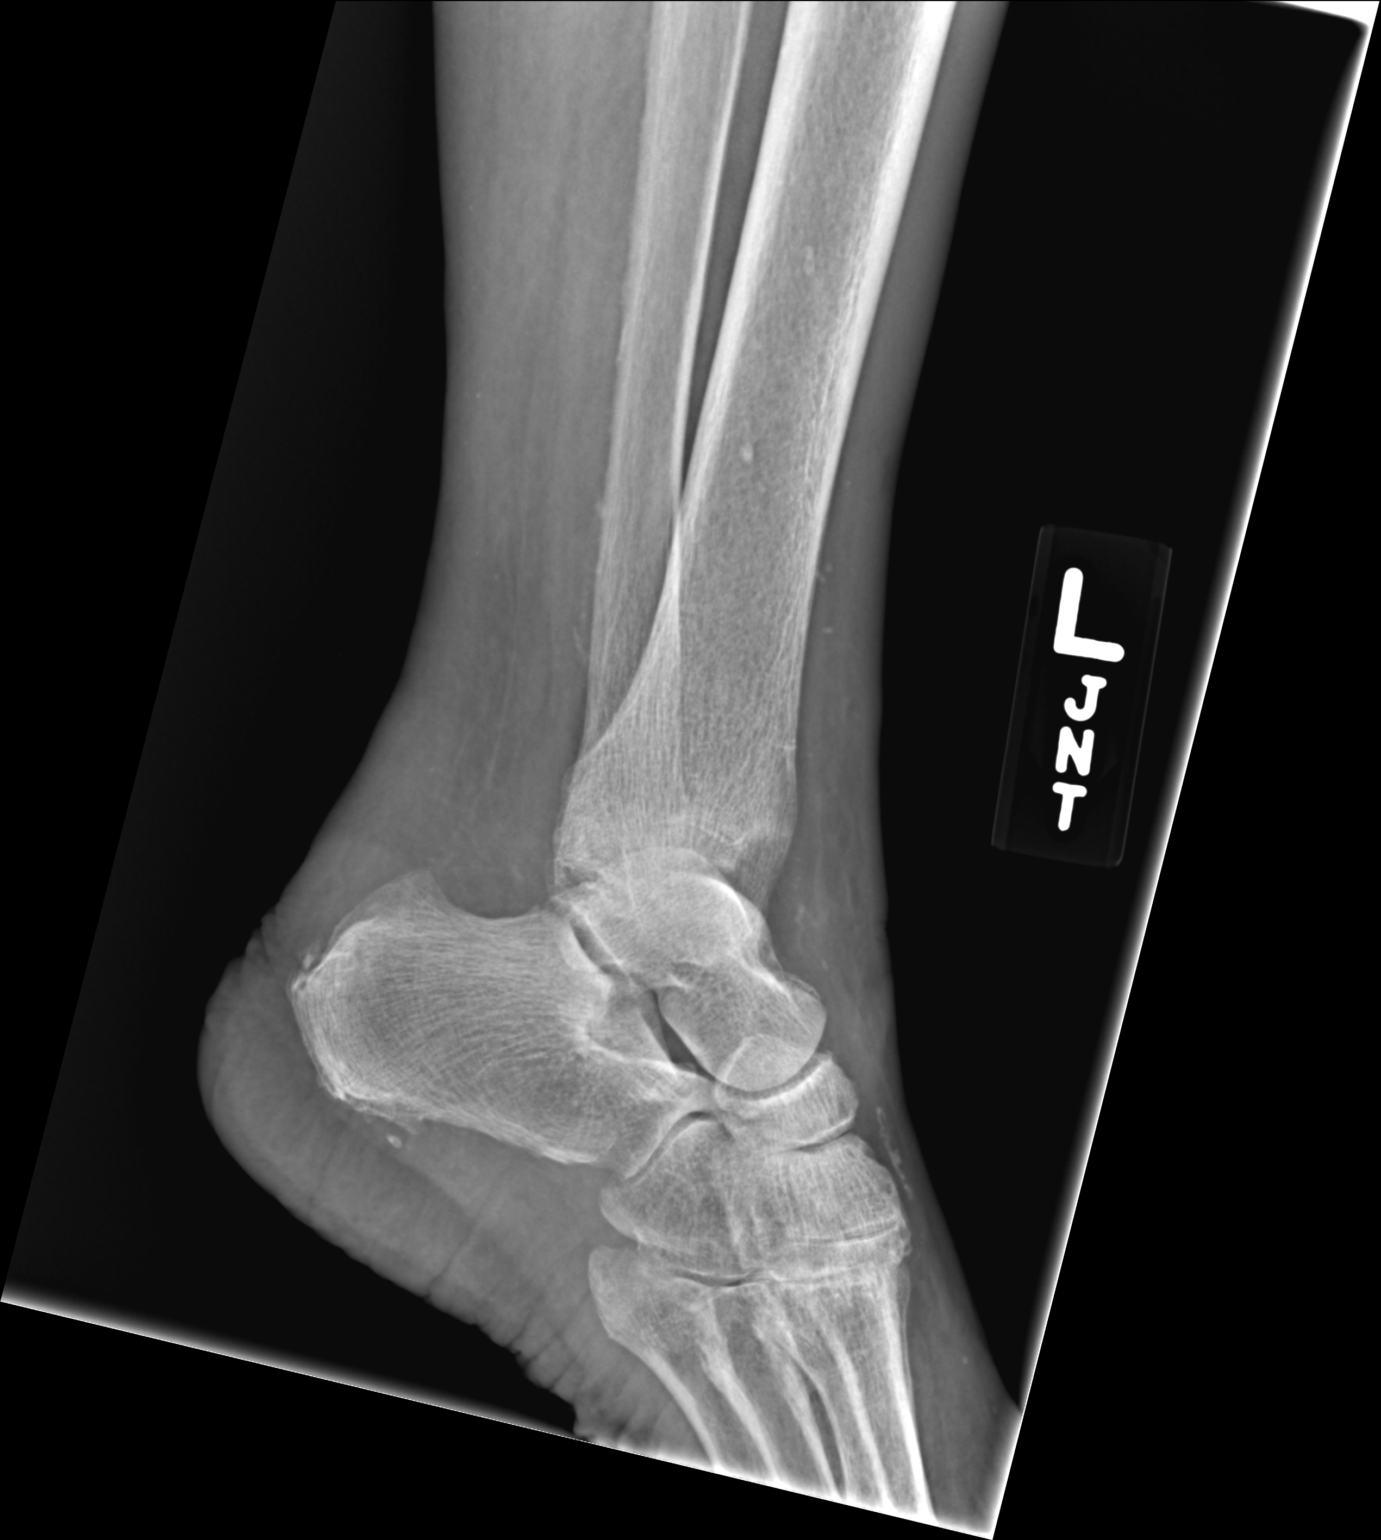

[1 of 1 positions shown; findings below may reference images not displayed]

[place]  * Location:

DIAGNOSTIC STUDIES

EXAM

XR ankle LT min 3V

INDICATION

left ankle pain
PT C/O CHRONIC LEFT ANKLE PAIN, PAIN ON LATERAL SIDE OF ANKLE, NO KNOWN INJURY. JT/KF

TECHNIQUE

AP lateral and oblique views

COMPARISONS

None available

FINDINGS

Diffuse demineralization is evident with small vessel calcification. No fractures are seen.

IMPRESSION

Diffuse demineralization. No fractures are evident.

Tech Notes:

PT C/O CHRONIC LEFT ANKLE PAIN, PAIN ON LATERAL SIDE OF ANKLE, NO KNOWN INJURY. JT/KF
